# Patient Record
Sex: Female | Born: 1955 | Race: White | Hispanic: No | Marital: Married | Smoking: Never smoker
Health system: Southern US, Community
[De-identification: ages and names within clinical notes are randomized; demographics above are authoritative.]

## PROBLEM LIST (undated history)

## (undated) DIAGNOSIS — L509 Urticaria, unspecified: Secondary | ICD-10-CM

## (undated) DIAGNOSIS — I341 Nonrheumatic mitral (valve) prolapse: Secondary | ICD-10-CM

## (undated) HISTORY — PX: HIP SURGERY: SHX245

## (undated) HISTORY — PX: SHOULDER SURGERY: SHX246

## (undated) HISTORY — DX: Urticaria, unspecified: L50.9

---

## 1977-02-05 HISTORY — PX: APPENDECTOMY: SHX54

## 1991-02-06 HISTORY — PX: OSTEOTOMY: SHX137

## 2008-05-24 ENCOUNTER — Other Ambulatory Visit: Admission: RE | Admit: 2008-05-24 | Discharge: 2008-05-24 | Payer: Self-pay | Admitting: Family Medicine

## 2008-06-01 ENCOUNTER — Encounter: Admission: RE | Admit: 2008-06-01 | Discharge: 2008-06-01 | Payer: Self-pay | Admitting: Family Medicine

## 2008-09-22 ENCOUNTER — Encounter: Admission: RE | Admit: 2008-09-22 | Discharge: 2008-09-22 | Payer: Self-pay | Admitting: Internal Medicine

## 2009-06-03 ENCOUNTER — Other Ambulatory Visit: Admission: RE | Admit: 2009-06-03 | Discharge: 2009-06-03 | Payer: Self-pay | Admitting: *Deleted

## 2010-02-26 ENCOUNTER — Encounter: Payer: Self-pay | Admitting: Family Medicine

## 2013-01-09 ENCOUNTER — Other Ambulatory Visit: Payer: Self-pay | Admitting: Family Medicine

## 2013-01-09 DIAGNOSIS — R1011 Right upper quadrant pain: Secondary | ICD-10-CM

## 2013-01-14 ENCOUNTER — Ambulatory Visit
Admission: RE | Admit: 2013-01-14 | Discharge: 2013-01-14 | Disposition: A | Discharge status: Home / Self Care | Payer: BC Managed Care – PPO | Source: Ambulatory Visit | Attending: Family Medicine | Admitting: Family Medicine

## 2013-01-14 DIAGNOSIS — R1011 Right upper quadrant pain: Secondary | ICD-10-CM

## 2015-02-23 ENCOUNTER — Ambulatory Visit: Payer: Self-pay | Admitting: Allergy and Immunology

## 2015-02-23 ENCOUNTER — Encounter: Payer: Self-pay | Admitting: Allergy and Immunology

## 2015-02-23 ENCOUNTER — Ambulatory Visit (INDEPENDENT_AMBULATORY_CARE_PROVIDER_SITE_OTHER): Payer: BLUE CROSS/BLUE SHIELD | Admitting: Allergy and Immunology

## 2015-02-23 VITALS — BP 128/76 | HR 75 | Temp 98.1°F | Resp 16 | Ht 63.78 in | Wt 171.3 lb

## 2015-02-23 DIAGNOSIS — R05 Cough: Secondary | ICD-10-CM | POA: Diagnosis not present

## 2015-02-23 DIAGNOSIS — H1045 Other chronic allergic conjunctivitis: Secondary | ICD-10-CM | POA: Diagnosis not present

## 2015-02-23 DIAGNOSIS — R053 Chronic cough: Secondary | ICD-10-CM | POA: Insufficient documentation

## 2015-02-23 DIAGNOSIS — H1010 Acute atopic conjunctivitis, unspecified eye: Secondary | ICD-10-CM

## 2015-02-23 DIAGNOSIS — J3089 Other allergic rhinitis: Secondary | ICD-10-CM

## 2015-02-23 DIAGNOSIS — J302 Other seasonal allergic rhinitis: Secondary | ICD-10-CM | POA: Insufficient documentation

## 2015-02-23 MED ORDER — AZELASTINE-FLUTICASONE 137-50 MCG/ACT NA SUSP: 1.0000 | Freq: Two times a day (BID) | NASAL | Status: DC | PRN

## 2015-02-23 MED ORDER — LEVALBUTEROL HCL 1.25 MG/3ML IN NEBU: 1.2500 mg | INHALATION_SOLUTION | Freq: Once | RESPIRATORY_TRACT | Status: DC

## 2015-02-23 MED ORDER — IPRATROPIUM BROMIDE 0.02 % IN SOLN: 0.5000 mg | Freq: Once | RESPIRATORY_TRACT | Status: DC

## 2015-02-23 NOTE — Patient Instructions (Addendum)
Persistent cough Most likely due to upper airway cough syndrome versus bronchial hyperresponsiveness, or multifactorial. Another possibility includes occult chronic sinusitis. We will aggressively treat postnasal drainage over the next month and reevaluate.   A prescription has been provided for Dymista (azelastine/fluticasone) nasal spray, 1 spray per nostril twice daily as needed. Proper nasal spray technique has been discussed and demonstrated.  Nasal saline lavage (NeilMed) as needed has been recommended along with instructions for proper administration.  Guaifenesin 1200 mg twice daily as needed with adequate hydration as discussed.   Secondhand cigarette smoke should be strictly eliminated from the patient's environment.  Perennial and seasonal allergic rhinitis Aeroallergen avoidance measures have been discussed and provided in written form.  A prescription has been provided for Dymista (as above).  Nasal saline lavage and guaifenesin (as above).  If allergen avoidance measures and medications fail to adequately relieve symptoms, we will consider immunotherapy.    Return in about 2 months (around 04/23/2015), or if symptoms worsen or fail to improve.  Control of House Dust Mite Allergen  House dust mites play a major role in allergic asthma and rhinitis.  They occur in environments with high humidity wherever human skin, the food for dust mites is found. High levels have been detected in dust obtained from mattresses, pillows, carpets, upholstered furniture, bed covers, clothes and soft toys.  The principal allergen of the house dust mite is found in its feces.  A gram of dust may contain 1,000 mites and 250,000 fecal particles.  Mite antigen is easily measured in the air during house cleaning activities.    1. Encase mattresses, including the box spring, and pillow, in an air tight cover.  Seal the zipper end of the encased mattresses with wide adhesive tape. 2. Wash the bedding  in water of 130 degrees Farenheit weekly.  Avoid cotton comforters/quilts and flannel bedding: the most ideal bed covering is the dacron comforter. 3. Remove all upholstered furniture from the bedroom. 4. Remove carpets, carpet padding, rugs, and non-washable window drapes from the bedroom.  Wash drapes weekly or use plastic window coverings. 5. Remove all non-washable stuffed toys from the bedroom.  Wash stuffed toys weekly. 6. Have the room cleaned frequently with a vacuum cleaner and a damp dust-mop.  The patient should not be in a room which is being cleaned and should wait 1 hour after cleaning before going into the room. 7. Close and seal all heating outlets in the bedroom.  Otherwise, the room will become filled with dust-laden air.  An electric heater can be used to heat the room. 8. Reduce indoor humidity to less than 50%.  Do not use a humidifier.    Reducing Pollen Exposure  The American Academy of Allergy, Asthma and Immunology suggests the following steps to reduce your exposure to pollen during allergy seasons.    1. Do not hang sheets or clothing out to dry; pollen may collect on these items. 2. Do not mow lawns or spend time around freshly cut grass; mowing stirs up pollen. 3. Keep windows closed at night.  Keep car windows closed while driving. 4. Minimize morning activities outdoors, a time when pollen counts are usually at their highest. 5. Stay indoors as much as possible when pollen counts or humidity is high and on windy days when pollen tends to remain in the air longer. 6. Use air conditioning when possible.  Many air conditioners have filters that trap the pollen spores. 7. Use a HEPA room air filter to  remove pollen form the indoor air you breathe.   Control of Mold Allergen  Mold and fungi can grow on a variety of surfaces provided certain temperature and moisture conditions exist.  Outdoor molds grow on plants, decaying vegetation and soil.  The major outdoor  mold, Alternaria and Cladosporium, are found in very high numbers during hot and dry conditions.  Generally, a late Summer - Fall peak is seen for common outdoor fungal spores.  Rain will temporarily lower outdoor mold spore count, but counts rise rapidly when the rainy period ends.  The most important indoor molds are Aspergillus and Penicillium.  Dark, humid and poorly ventilated basements are ideal sites for mold growth.  The next most common sites of mold growth are the bathroom and the kitchen.  Outdoor [employer] 3. Use air conditioning and keep windows closed 4. Avoid exposure to decaying vegetation. 5. Avoid leaf raking. 6. Avoid grain handling. 7. Consider wearing a face mask if working in moldy areas.  Indoor Mold Control 1. Maintain humidity below 50%. 2. Clean washable surfaces with 5% bleach solution. 3. Remove sources e.g. Contaminated carpets.

## 2015-02-23 NOTE — Assessment & Plan Note (Signed)
Aeroallergen avoidance measures have been discussed and provided in written form.  A prescription has been provided for Dymista (as above).  Nasal saline lavage and guaifenesin (as above).  If allergen avoidance measures and medications fail to adequately relieve symptoms, we will consider immunotherapy.

## 2015-02-23 NOTE — Progress Notes (Signed)
New Patient Note  RE: Tiffany Cunningham MRN: [id number] DOB: 1955-10-15 Date of Office Visit: 02/23/2015  Referring provider: Ladora Daniel, PA-C Primary care provider: Ladora Daniel, PA-C  Chief Complaint: Cough   History of present illness: HPI Comments: Tiffany Cunningham is a 60 y.o. female who presents today for consultation of coughing and rhinitis.  She reports that she has been coughing "most of my life."  The cough is described as productive, particularly in the morning.  She has not awakened from her sleep by the cough.  The cough is exacerbated by exposure to cigarette smoke, certain strong aromas, and with upper respiratory tract infections.  She reports that the cough exacerbation lingers for weeks after other symptoms from the upper respiratory tract infection have resolved.  She denies labored breathing, wheezing, or a history of childhood asthma.  She denies heartburn or water brash.  She has tried proton pump inhibitors without perceived improvement regarding the coughing.  She is not taking an ACE inhibitor.  She experiences nasal congestion, rhinorrhea, postnasal drainage, sneezing, and ocular pruritus.  These symptoms occur year around but tend to be most severe in the springtime.   Assessment and plan: Persistent cough Most likely due to upper airway cough syndrome versus bronchial hyperresponsiveness, or multifactorial. Another possibility includes occult chronic sinusitis. We will aggressively treat postnasal drainage over the next month and reevaluate.   A prescription has been provided for Dymista (azelastine/fluticasone) nasal spray, 1 spray per nostril twice daily as needed. Proper nasal spray technique has been discussed and demonstrated.  Nasal saline lavage (NeilMed) as needed has been recommended along with instructions for proper administration.  Guaifenesin 1200 mg twice daily as needed with adequate hydration as discussed.   Secondhand cigarette smoke should be  strictly eliminated from the patient's environment.  Perennial and seasonal allergic rhinoconjunctivitis Aeroallergen avoidance measures have been discussed and provided in written form.  A prescription has been provided for Dymista (as above).  Nasal saline lavage and guaifenesin (as above).  If allergen avoidance measures and medications fail to adequately relieve symptoms, we will consider immunotherapy.    Meds ordered this encounter  Medications  . levalbuterol (XOPENEX) nebulizer solution 1.25 mg    Sig:   . ipratropium (ATROVENT) nebulizer solution 0.5 mg    Sig:   . Azelastine-Fluticasone 137-50 MCG/ACT SUSP    Sig: Place 1 spray into the nose 2 (two) times daily as needed.    Dispense:  1 Bottle    Refill:  5    Diagnositics: Spirometry: FVC was 2.91 L and FEV1 was 2.45 L (98% predicted) without significant postbronchodilator improvement. Allergy skin testing: Positive to grass pollen, ragweed pollen, weed pollen, tree pollen, molds, and dust mite antigen.    Physical examination: Blood pressure 128/76, pulse 75, temperature 98.1 F (36.7 C), temperature source Oral, resp. rate 16, height 5' 3.78" (1.62 m), weight 171 lb 4.8 oz (77.7 kg), SpO2 97 %.  General: Alert, interactive, in no acute distress. HEENT: TMs pearly gray, turbinates moderately edematous without discharge, post-pharynx erythematous. Neck: Supple without lymphadenopathy. Lungs: Clear to auscultation without wheezing, rhonchi or rales. CV: Normal S1, S2 without murmurs. Abdomen: Nondistended, nontender. Skin: Warm and dry, without lesions or rashes. Extremities:  No clubbing, cyanosis or edema. Neuro:   Grossly intact.  Review of systems: Review of Systems  Constitutional: Negative for fever, chills and weight loss.  HENT: Positive for congestion. Negative for nosebleeds.   Eyes: Negative for blurred vision.  Respiratory: Positive  for cough. Negative for hemoptysis and wheezing.     Cardiovascular: Negative for chest pain.  Gastrointestinal: Negative for heartburn, diarrhea and constipation.  Genitourinary: Negative for dysuria.  Musculoskeletal: Negative for myalgias and joint pain.  Skin: Negative for itching and rash.  Neurological: Negative for dizziness.  Endo/Heme/Allergies: Positive for environmental allergies. Does not bruise/bleed easily.    Past medical history: Past Medical History  Diagnosis Date  . Urticaria     Past surgical history: Past Surgical History  Procedure Laterality Date  . Appendectomy  1979  . Osteotomy  1993    Family history: Family History  Problem Relation Age of Onset  . Allergic rhinitis Mother   . Allergic rhinitis Sister   . Asthma Sister   . Urticaria Neg Hx   . Immunodeficiency Neg Hx   . Asthma Cousin   . Eczema Daughter     Social history: Social History   Social History  . Marital Status: Unknown    Spouse Name: N/A  . Number of Children: N/A  . Years of Education: N/A   Occupational History  . Not on file.   Social History Main Topics  . Smoking status: Never Smoker   . Smokeless tobacco: Never Used  . Alcohol Use: Not on file  . Drug Use: Not on file  . Sexual Activity: Not on file   Other Topics Concern  . Not on file   Social History Narrative  . No narrative on file   Environmental History:  Kamron lives in 77-year-old house with hardwood floors throughout and central air/heat.  There is a cat in the house which has access to her bedroom.  She is a nonsmoker but is exposed to secondhand cigarette smoke in the home.    Medication List       This list is accurate as of: 02/23/15  1:01 PM.  Always use your most recent med list.               aspirin 81 MG tablet  Take 81 mg by mouth daily.     Azelastine-Fluticasone 137-50 MCG/ACT Susp  Place 1 spray into the nose 2 (two) times daily as needed.     DRY EYES OP  Apply 1 drop to eye 2 (two) times daily.     Fish Oil 1000 MG  Caps  Take 1 capsule by mouth daily.     HYZAAR 50-12.5 MG tablet  Generic drug:  losartan-hydrochlorothiazide  Take 1 tablet by mouth daily.     predniSONE 10 MG (21) Tbpk tablet  Commonly known as:  STERAPRED UNI-PAK 21 TAB  Take 2 tablets by mouth 2 (two) times daily. Then will taper down one daily for 6 days        Known medication allergies: Allergies  Allergen Reactions  . Ace Inhibitors     Other reaction(s): Cough    I appreciate the opportunity to take part in this Tiffany Cunningham's care. Please do not hesitate to contact me with questions.  Sincerely,   R. Jorene Guest, MD

## 2015-02-23 NOTE — Assessment & Plan Note (Addendum)
Most likely due to upper airway cough syndrome versus bronchial hyperresponsiveness, or multifactorial. Another possibility includes occult chronic sinusitis. We will aggressively treat postnasal drainage over the next month and reevaluate.   A prescription has been provided for Dymista (azelastine/fluticasone) nasal spray, 1 spray per nostril twice daily as needed. Proper nasal spray technique has been discussed and demonstrated.  Nasal saline lavage (NeilMed) as needed has been recommended along with instructions for proper administration.  Guaifenesin 1200 mg twice daily as needed with adequate hydration as discussed.   Secondhand cigarette smoke should be strictly eliminated from the patient's environment.

## 2015-02-24 NOTE — Addendum Note (Signed)
Addended by: Lauris Poag on: 02/24/2015 09:34 AM   Modules accepted: Orders

## 2015-02-25 ENCOUNTER — Encounter: Payer: Self-pay | Admitting: *Deleted

## 2015-03-10 ENCOUNTER — Ambulatory Visit: Payer: Self-pay | Admitting: Cardiology

## 2015-03-21 ENCOUNTER — Encounter: Payer: Self-pay | Admitting: Cardiology

## 2015-03-22 ENCOUNTER — Encounter: Payer: Self-pay | Admitting: Cardiology

## 2015-03-22 ENCOUNTER — Ambulatory Visit (INDEPENDENT_AMBULATORY_CARE_PROVIDER_SITE_OTHER): Payer: BLUE CROSS/BLUE SHIELD | Admitting: Cardiology

## 2015-03-22 VITALS — BP 138/82 | HR 68 | Ht 63.5 in | Wt 172.1 lb

## 2015-03-22 DIAGNOSIS — R002 Palpitations: Secondary | ICD-10-CM | POA: Diagnosis not present

## 2015-03-22 DIAGNOSIS — I341 Nonrheumatic mitral (valve) prolapse: Secondary | ICD-10-CM

## 2015-03-22 DIAGNOSIS — I340 Nonrheumatic mitral (valve) insufficiency: Secondary | ICD-10-CM

## 2015-03-22 DIAGNOSIS — I10 Essential (primary) hypertension: Secondary | ICD-10-CM | POA: Diagnosis not present

## 2015-03-22 NOTE — Progress Notes (Signed)
Cardiology Office Note    Date:  03/22/2015   ID:  ZABDI MIS, DOB 1955-12-23, MRN [id number]  PCP:  Ladora Daniel, PA-C  Cardiologist:   Donato Schultz, MD     History of Present Illness:  Tiffany Cunningham is a 60 y.o. female here for the evaluation of heart murmur. She is to see a cardiologist for mitral valve prolapse followed with yearly echoes but had not had one done in several years. No significant shortness of breath, no fevers, no chills. No prior episodes of endocarditis. She is being treated well for hypertension. Medications reviewed. Cough has been prominent.  Overall she is not having any symptoms from her mitral valve prolapse. She has never had endocarditis. No shortness of breath, occasionally when laying flat she may feel some chest heaviness however this is chronic and she thinks related to her cough. No orthopnea.  She is never smoked. Her mother and father have not had early coronary disease.  She had palpitations once, went to emergency room for this. It was several years ago. In that she had tried atenolol and propranolol. These medications did not make a difference in her palpitations. She's not had any syncope and they're mostly a nuisance.   Past Medical History  Diagnosis Date  . Urticaria     Past Surgical History  Procedure Laterality Date  . Appendectomy  1979  . Osteotomy  1993    Outpatient Prescriptions Prior to Visit  Medication Sig Dispense Refill  . Artificial Tear Ointment (DRY EYES OP) Apply 1 drop to eye 2 (two) times daily.    Marland Kitchen aspirin 81 MG tablet Take 81 mg by mouth daily.    . Azelastine-Fluticasone 137-50 MCG/ACT SUSP Place 1 spray into the nose 2 (two) times daily as needed. 1 Bottle 5  . losartan-hydrochlorothiazide (HYZAAR) 50-12.5 MG tablet Take 1 tablet by mouth daily.    . Omega-3 Fatty Acids (FISH OIL) 1000 MG CAPS Take 1 capsule by mouth daily.    . predniSONE (STERAPRED UNI-PAK 21 TAB) 10 MG (21) TBPK tablet Take 2 tablets  by mouth 2 (two) times daily. Then will taper down one daily for 6 days     Facility-Administered Medications Prior to Visit  Medication Dose Route Frequency Provider Last Rate Last Dose  . ipratropium (ATROVENT) nebulizer solution 0.5 mg  0.5 mg Nebulization Once Cristal Ford, MD      . levalbuterol Pauline Aus) nebulizer solution 1.25 mg  1.25 mg Nebulization Once Cristal Ford, MD         Allergies:   Ace inhibitors   Social History   Social History  . Marital Status: Unknown    Spouse Name: N/A  . Number of Children: N/A  . Years of Education: N/A   Social History Main Topics  . Smoking status: Never Smoker   . Smokeless tobacco: Never Used  . Alcohol Use: None  . Drug Use: None  . Sexual Activity: Not Asked   Other Topics Concern  . None   Social History Narrative     Family History:  The patient's family history includes Allergic rhinitis in her mother and sister; Asthma in her cousin and sister; Eczema in her daughter. There is no history of Urticaria or Immunodeficiency.   ROS:   Please see the history of present illness.    ROS positive for cough, palpitations All other systems reviewed and are negative.   PHYSICAL EXAM:   VS:  BP 138/82 mmHg  Pulse 68  Ht 5' 3.5" (1.613 m)  Wt 172 lb 1.9 oz (78.073 kg)  BMI 30.01 kg/m2   GEN: Well nourished, well developed, in no acute distress HEENT: normalOccasional cough Neck: no JVD, carotid bruits, or masses Cardiac: RRR; 2/6 holosystolic murmur heard throughout precordium,no rubs, or gallops,no edema  Respiratory:  clear to auscultation bilaterally, normal work of breathing GI: soft, nontender, nondistended, + BS MS: no deformity or atrophy Skin: warm and dry, no rash Neuro:  Alert and Oriented x 3, Strength and sensation are intact Psych: euthymic mood, full affect  Wt Readings from Last 3 Encounters:  03/22/15 172 lb 1.9 oz (78.073 kg)  02/23/15 171 lb 4.8 oz (77.7 kg)      Studies/Labs  Reviewed:   EKG:  EKG is ordered today.  The ekg ordered today demonstrates 03/22/15-sinus rhythm, 68, no other abnormalities. Personally viewed  Recent Labs: No results found for requested labs within last 365 days.   Lipid Panel No results found for: CHOL, TRIG, HDL, CHOLHDL, VLDL, LDLCALC, LDLDIRECT  Additional studies/ records that were reviewed today include:  Office records from [facility] [place], [place] EKG reviewed    ASSESSMENT:    1. Mitral valve prolapse   2. Mitral regurgitation   3. Palpitations   4. Essential hypertension      PLAN:  In order of problems listed above:  1. Mitral valve prolapse-we will go and check an echocardiogram. It is been several years. We discussed the surgical indications for repair. Severe mitral regurgitation with symptoms. We also discussed that she does not require prophylactic antibiotics prior to dental procedures. She understands this. If symptoms change she will let us know. 2. Palpitations-if these become more frequent or worrisome, she will let us know. We always try a new formulation of beta blocker such as metoprolol or perhaps try a calcium channel blocker such as diltiazem. At this point, she does not desire to start any new medications and I agree. 3. Essential hypertension-under good control. Medication reviewed.    Medication Adjustments/Labs and Tests Ordered: Current medicines are reviewed at length with the patient today.  Concerns regarding medicines are outlined above.  Medication changes, Labs and Tests ordered today are listed in the Patient Instructions below. Patient Instructions  Medication Instructions:  Your physician recommends that you continue on your current medications as directed. Please refer to the Current Medication list given to you today.   Labwork: None ordered  Testing/Procedures: Your physician has requested that you have an echocardiogram. Echocardiography is a painless test that uses sound waves  to create images of your heart. It provides your doctor with information about the size and shape of your heart and how well your heart's chambers and valves are working. This procedure takes approximately one hour. There are no restrictions for this procedure.   Follow-Up: Your physician wants you to follow-up in: 1 year with Dr.Skains You will receive a reminder letter in the mail two months in advance. If you don't receive a letter, please call our office to schedule the follow-up appointment.   Any Other Special Instructions Will Be Listed Below (If Applicable).     If you need a refill on your cardiac medications before your next appointment, please call your pharmacy.         Mathews Robinsons, MD  03/22/2015 11:51 AM    [facility] [address] [place], [place], [place]  [address] Phone: [phone]; Fax: [phone]

## 2015-03-22 NOTE — Patient Instructions (Signed)
Medication Instructions:  Your physician recommends that you continue on your current medications as directed. Please refer to the Current Medication list given to you today.   Labwork: None ordered  Testing/Procedures: Your physician has requested that you have an echocardiogram. Echocardiography is a painless test that uses sound waves to create images of your heart. It provides your doctor with information about the size and shape of your heart and how well your heart's chambers and valves are working. This procedure takes approximately one hour. There are no restrictions for this procedure.   Follow-Up: Your physician wants you to follow-up in: 1 year with Dr.Skains You will receive a reminder letter in the mail two months in advance. If you don't receive a letter, please call our office to schedule the follow-up appointment.   Any Other Special Instructions Will Be Listed Below (If Applicable).     If you need a refill on your cardiac medications before your next appointment, please call your pharmacy.   

## 2015-04-01 ENCOUNTER — Ambulatory Visit (HOSPITAL_COMMUNITY): Payer: BLUE CROSS/BLUE SHIELD | Attending: Cardiovascular Disease

## 2015-04-01 ENCOUNTER — Other Ambulatory Visit: Payer: Self-pay

## 2015-04-01 DIAGNOSIS — I071 Rheumatic tricuspid insufficiency: Secondary | ICD-10-CM | POA: Insufficient documentation

## 2015-04-01 DIAGNOSIS — I313 Pericardial effusion (noninflammatory): Secondary | ICD-10-CM | POA: Insufficient documentation

## 2015-04-01 DIAGNOSIS — I340 Nonrheumatic mitral (valve) insufficiency: Secondary | ICD-10-CM | POA: Diagnosis not present

## 2015-04-01 DIAGNOSIS — I119 Hypertensive heart disease without heart failure: Secondary | ICD-10-CM | POA: Diagnosis not present

## 2015-04-01 DIAGNOSIS — I341 Nonrheumatic mitral (valve) prolapse: Secondary | ICD-10-CM | POA: Insufficient documentation

## 2015-04-25 ENCOUNTER — Ambulatory Visit (INDEPENDENT_AMBULATORY_CARE_PROVIDER_SITE_OTHER): Payer: BLUE CROSS/BLUE SHIELD | Admitting: Allergy and Immunology

## 2015-04-25 ENCOUNTER — Encounter: Payer: Self-pay | Admitting: Allergy and Immunology

## 2015-04-25 VITALS — BP 130/80 | HR 78 | Temp 97.9°F | Resp 16

## 2015-04-25 DIAGNOSIS — R053 Chronic cough: Secondary | ICD-10-CM

## 2015-04-25 DIAGNOSIS — J3089 Other allergic rhinitis: Secondary | ICD-10-CM | POA: Diagnosis not present

## 2015-04-25 DIAGNOSIS — R05 Cough: Secondary | ICD-10-CM

## 2015-04-25 NOTE — Progress Notes (Signed)
    Follow-up Note  RE: Tiffany Cunningham MRN: [id number] DOB: Jan 13, 1956 Date of Office Visit: 04/25/2015  Primary care provider: Ladora DanielBEAL, SHERI, PA-C Referring provider: Ladora DanielBeal, Sheri, PA-C  History of present illness: HPI Comments: Tiffany Cunningham is a 60 y.o. female with a history of persistent cough and allergic rhinoconjunctivitis who presents today for follow up.  She was previously seen in this office on 02/23/2015 for her initial visit.  She reports that the cough resolved with the treatment plan provided during her initial evaluation.  Because her symptoms had been so well controlled she discontinued recommended nasal saline irrigation, Dymista, and guaifenesin 2 weeks ago.  Over the past week the cough has started to recur though to a much lesser degree than it had been previously.  She has no nasal or ocular symptom complaints today.   Assessment and plan: Persistent cough Significantly improved.  Continue nasal saline irrigation, Dymista nasal spray as needed, and/or guaifenesin as needed with adequate hydration.  Perennial and seasonal allergic rhinoconjunctivitis  Continue appropriate allergen avoidance measures and Dymista as needed.      Physical examination: Blood pressure 130/80, pulse 78, temperature 97.9 F (36.6 C), temperature source Oral, resp. rate 16.  General: Alert, interactive, in no acute distress. HEENT: TMs pearly gray, turbinates mildly edematous without discharge, post-pharynx moderately erythematous. Neck: Supple without lymphadenopathy. Lungs: Clear to auscultation without wheezing, rhonchi or rales. CV: Normal S1, S2 without murmurs. Skin: Warm and dry, without lesions or rashes.  The following portions of the patient's history were reviewed and updated as appropriate: allergies, current medications, past family history, past medical history, past social history, past surgical history and problem list.    Medication List       This list is accurate  as of: 04/25/15  6:38 PM.  Always use your most recent med list.               aspirin 81 MG tablet  Take 81 mg by mouth daily.     Azelastine-Fluticasone 137-50 MCG/ACT Susp  Place 1 spray into the nose 2 (two) times daily as needed.     DRY EYES OP  Apply 1 drop to eye 2 (two) times daily.     Fish Oil 1000 MG Caps  Take 1 capsule by mouth daily.     HYZAAR 50-12.5 MG tablet  Generic drug:  losartan-hydrochlorothiazide  Take 1 tablet by mouth daily.     meloxicam 15 MG tablet  Commonly known as:  MOBIC  Reported on 04/25/2015     predniSONE 10 MG (21) Tbpk tablet  Commonly known as:  STERAPRED UNI-PAK 21 TAB  Take 2 tablets by mouth 2 (two) times daily. Reported on 04/25/2015        Allergies  Allergen Reactions  . Ace Inhibitors     Other reaction(s): Cough    I appreciate the opportunity to take part in this Richard's care. Please do not hesitate to contact me with questions.  Sincerely,   R. Jorene Guestarter Estell Dillinger, MD

## 2015-04-25 NOTE — Assessment & Plan Note (Signed)
Significantly improved.  Continue nasal saline irrigation, Dymista nasal spray as needed, and/or guaifenesin as needed with adequate hydration.

## 2015-04-25 NOTE — Patient Instructions (Addendum)
Persistent cough Significantly improved.  Continue nasal saline irrigation, Dymista nasal spray as needed, and/or guaifenesin as needed with adequate hydration.  Perennial and seasonal allergic rhinoconjunctivitis  Continue appropriate allergen avoidance measures and Dymista as needed.    Return in about 6 months (around 10/26/2015), or if symptoms worsen or fail to improve.

## 2015-04-25 NOTE — Assessment & Plan Note (Signed)
   Continue appropriate allergen avoidance measures and Dymista as needed. 

## 2015-10-17 ENCOUNTER — Encounter (INDEPENDENT_AMBULATORY_CARE_PROVIDER_SITE_OTHER): Payer: Self-pay

## 2015-10-17 ENCOUNTER — Ambulatory Visit (INDEPENDENT_AMBULATORY_CARE_PROVIDER_SITE_OTHER): Payer: BLUE CROSS/BLUE SHIELD | Admitting: Allergy and Immunology

## 2015-10-17 ENCOUNTER — Encounter: Payer: Self-pay | Admitting: Allergy and Immunology

## 2015-10-17 VITALS — BP 146/82 | HR 98 | Resp 18

## 2015-10-17 DIAGNOSIS — J3089 Other allergic rhinitis: Secondary | ICD-10-CM

## 2015-10-17 DIAGNOSIS — R053 Chronic cough: Secondary | ICD-10-CM

## 2015-10-17 DIAGNOSIS — R05 Cough: Secondary | ICD-10-CM

## 2015-10-17 NOTE — Assessment & Plan Note (Signed)
Stable.  Continue allergen avoidance measures and Dymista nasal spray as needed and nasal saline irrigation as needed.

## 2015-10-17 NOTE — Patient Instructions (Signed)
Persistent cough Improved and stable.  Continue as needed nasal saline irrigation, Dymista nasal spray, and/or guaifenesin as needed with adequate hydration.  Perennial and seasonal allergic rhinoconjunctivitis Stable.  Continue allergen avoidance measures and Dymista nasal spray as needed and nasal saline irrigation as needed.   Return in about 6 months (around 04/15/2016), or if symptoms worsen or fail to improve.

## 2015-10-17 NOTE — Assessment & Plan Note (Signed)
Improved and stable.  Continue as needed nasal saline irrigation, Dymista nasal spray, and/or guaifenesin as needed with adequate hydration.

## 2015-10-17 NOTE — Progress Notes (Signed)
    Follow-up Note  RE: Tiffany Cunningham MRN: [id number] DOB: 1955/07/04 Date of Office Visit: 10/17/2015  Primary care provider: Ladora DanielBEAL, Tiffany Cunningham Referring provider: Ladora DanielBeal, Tiffany Cunningham  History of present illness: Tiffany Cunningham is a 60 y.o. female with a history of persistent cough and allergic rhinoconjunctivitis presenting today for follow up.  She was previously seen in this office on 04/25/15.  She reports that in the interval since her previous visit her coughing has improved.  She still experiences coughing occasionally but it is tolerable.  The cough originates as a tickle at the base of her throat.  She currently uses nasal saline lavage regularly and Dymista sporadically as needed.  She has no nasal or ocular allergy symptom complaints today.   Assessment and plan: Persistent cough Improved and stable.  Continue as needed nasal saline irrigation, Dymista nasal spray, and/or guaifenesin as needed with adequate hydration.  Perennial and seasonal allergic rhinoconjunctivitis Stable.  Continue allergen avoidance measures and Dymista nasal spray as needed and nasal saline irrigation as needed.   Diagnositics: Spirometry:  Normal with an FEV1 of 98% predicted.  Please see scanned spirometry results for details.    Physical examination: Blood pressure (!) 146/82, pulse 98, resp. rate 18.  General: Alert, interactive, in no acute distress. HEENT: TMs pearly gray, turbinates mildly edematous without discharge, post-pharynx mildly erythematous. Neck: Supple without lymphadenopathy. Lungs: Clear to auscultation without wheezing, rhonchi or rales. CV: Normal S1, S2 without murmurs. Skin: Warm and dry, without lesions or rashes.  The following portions of the patient's history were reviewed and updated as appropriate: allergies, current medications, past family history, past medical history, past social history, past surgical history and problem list.    Medication List         Accurate as of 10/17/15 11:12 AM. Always use your most recent med list.          aspirin 81 MG tablet Take 81 mg by mouth daily.   Azelastine-Fluticasone 137-50 MCG/ACT Susp Place 1 spray into the nose 2 (two) times daily as needed.   DRY EYES OP Apply 1 drop to eye 2 (two) times daily.   Fish Oil 1000 MG Caps Take 1 capsule by mouth daily.   HYZAAR 50-12.5 MG tablet Generic drug:  losartan-hydrochlorothiazide Take 1 tablet by mouth daily.   meloxicam 15 MG tablet Commonly known as:  MOBIC Reported on 04/25/2015   predniSONE 10 MG (21) Tbpk tablet Commonly known as:  STERAPRED UNI-PAK 21 TAB Take 2 tablets by mouth 2 (two) times daily. Reported on 04/25/2015       Allergies  Allergen Reactions  . Ace Inhibitors     Other reaction(s): Cough    I appreciate the opportunity to take part in Tiffany Cunningham's care. Please do not hesitate to contact me with questions.  Sincerely,   R. Jorene Guestarter Tiffany Hudman, MD

## 2015-11-11 ENCOUNTER — Encounter (HOSPITAL_COMMUNITY): Payer: Self-pay | Admitting: *Deleted

## 2015-11-11 ENCOUNTER — Emergency Department (HOSPITAL_COMMUNITY)
Admission: EM | Admit: 2015-11-11 | Discharge: 2015-11-11 | Disposition: A | Discharge status: Home / Self Care | Payer: BLUE CROSS/BLUE SHIELD | Attending: Emergency Medicine | Admitting: Emergency Medicine

## 2015-11-11 ENCOUNTER — Emergency Department (HOSPITAL_COMMUNITY): Payer: BLUE CROSS/BLUE SHIELD

## 2015-11-11 DIAGNOSIS — Z79899 Other long term (current) drug therapy: Secondary | ICD-10-CM | POA: Diagnosis not present

## 2015-11-11 DIAGNOSIS — I10 Essential (primary) hypertension: Secondary | ICD-10-CM | POA: Diagnosis not present

## 2015-11-11 DIAGNOSIS — F410 Panic disorder [episodic paroxysmal anxiety] without agoraphobia: Secondary | ICD-10-CM | POA: Diagnosis not present

## 2015-11-11 DIAGNOSIS — Z7982 Long term (current) use of aspirin: Secondary | ICD-10-CM | POA: Diagnosis not present

## 2015-11-11 DIAGNOSIS — R002 Palpitations: Secondary | ICD-10-CM | POA: Diagnosis present

## 2015-11-11 LAB — BASIC METABOLIC PANEL
Anion gap: 12 (ref 5–15)
BUN: 18 mg/dL (ref 6–20)
CO2: 24 mmol/L (ref 22–32)
Calcium: 9.3 mg/dL (ref 8.9–10.3)
Chloride: 103 mmol/L (ref 101–111)
Creatinine, Ser: 0.72 mg/dL (ref 0.44–1.00)
GFR calc Af Amer: >60 mL/min (ref >60)
GLUCOSE: 109 mg/dL — AB (ref 65–99)
POTASSIUM: 3.8 mmol/L (ref 3.5–5.1)
Sodium: 139 mmol/L (ref 135–145)

## 2015-11-11 LAB — CBC WITH DIFFERENTIAL/PLATELET
Basophils Absolute: 0 10*3/uL (ref 0.0–0.1)
Basophils Relative: 0 %
EOS PCT: 1 %
Eosinophils Absolute: 0.1 10*3/uL (ref 0.0–0.7)
HCT: 39.5 % (ref 36.0–46.0)
Hemoglobin: 12.9 g/dL (ref 12.0–15.0)
LYMPHS ABS: 1.6 10*3/uL (ref 0.7–4.0)
LYMPHS PCT: 15 %
MCH: 29.3 pg (ref 26.0–34.0)
MCHC: 32.7 g/dL (ref 30.0–36.0)
MCV: 89.6 fL (ref 78.0–100.0)
MONO ABS: 0.9 10*3/uL (ref 0.1–1.0)
MONOS PCT: 8 %
Neutro Abs: 8 10*3/uL — ABNORMAL HIGH (ref 1.7–7.7)
Neutrophils Relative %: 76 %
PLATELETS: 307 10*3/uL (ref 150–400)
RBC: 4.41 MIL/uL (ref 3.87–5.11)
RDW: 12.3 % (ref 11.5–15.5)
WBC: 10.5 10*3/uL (ref 4.0–10.5)

## 2015-11-11 LAB — D-DIMER, QUANTITATIVE: D-Dimer, Quant: 1.2 ug/mL-FEU — ABNORMAL HIGH (ref 0.00–0.50)

## 2015-11-11 MED ORDER — IOPAMIDOL (ISOVUE-370) INJECTION 76%
100.0000 mL | Freq: Once | INTRAVENOUS | Status: AC | PRN
  Administered 2015-11-11: 60 mL via INTRAVENOUS

## 2015-11-11 MED ORDER — LORAZEPAM 1 MG PO TABS
1.0000 mg | ORAL_TABLET | Freq: Once | ORAL | Status: AC
  Administered 2015-11-11: 1 mg via ORAL
  Filled 2015-11-11: qty 1

## 2015-11-11 NOTE — ED Notes (Cosign Needed)
Received signout from Dr. Ross Marcusourtney Horton at the beginning of shift. Patient with recent foot surgery presenting with symptoms suggestive of a panic attack. Given the recent surgery, a d-dimer was obtained and was elevated. Chest CT angiogram performed showing no evidence of PE. Patient reassured. She does not have any new symptoms and felt comfortable going home. She will follow up appropriately with her orthopedist. Return precaution discussed.  BP 115/65   Pulse 74   Temp 97.3 F (36.3 C) (Oral)   Resp 13   SpO2 99%   Results for orders placed or performed during the hospital encounter of 11/11/15  CBC with Differential  Result Value Ref Range   WBC 10.5 4.0 - 10.5 K/uL   RBC 4.41 3.87 - 5.11 MIL/uL   Hemoglobin 12.9 12.0 - 15.0 g/dL   HCT 13.039.5 86.536.0 - 78.446.0 %   MCV 89.6 78.0 - 100.0 fL   MCH 29.3 26.0 - 34.0 pg   MCHC 32.7 30.0 - 36.0 g/dL   RDW 69.612.3 29.511.5 - 28.415.5 %   Platelets 307 150 - 400 K/uL   Neutrophils Relative % 76 %   Neutro Abs 8.0 (H) 1.7 - 7.7 K/uL   Lymphocytes Relative 15 %   Lymphs Abs 1.6 0.7 - 4.0 K/uL   Monocytes Relative 8 %   Monocytes Absolute 0.9 0.1 - 1.0 K/uL   Eosinophils Relative 1 %   Eosinophils Absolute 0.1 0.0 - 0.7 K/uL   Basophils Relative 0 %   Basophils Absolute 0.0 0.0 - 0.1 K/uL  Basic metabolic panel  Result Value Ref Range   Sodium 139 135 - 145 mmol/L   Potassium 3.8 3.5 - 5.1 mmol/L   Chloride 103 101 - 111 mmol/L   CO2 24 22 - 32 mmol/L   Glucose, Bld 109 (H) 65 - 99 mg/dL   BUN 18 6 - 20 mg/dL   Creatinine, Ser 1.320.72 0.44 - 1.00 mg/dL   Calcium 9.3 8.9 - 44.010.3 mg/dL   GFR calc non Af Amer >60 >60 mL/min   GFR calc Af Amer >60 >60 mL/min   Anion gap 12 5 - 15  D[facility], [facility] ([facility])  Result Value Ref Range   D-Dimer, Quant 1.20 (H) 0.00 - 0.50 ug/mL-FEU   Ct Angio Chest Pe W And/or Wo Contrast  Result Date: 11/11/2015 CLINICAL DATA:  Palpitations starting last night. Elevated D-dimer. Right foot surgery 3 weeks  ago. EXAM: CT ANGIOGRAPHY CHEST WITH CONTRAST TECHNIQUE: Multidetector CT imaging of the chest was performed using the standard protocol during bolus administration of intravenous contrast. Multiplanar CT image reconstructions and MIPs were obtained to evaluate the vascular anatomy. CONTRAST:  60 mL Isovue 370 COMPARISON:  None. FINDINGS: Cardiovascular: Good opacification of the central and segmental pulmonary arteries. No focal filling defects demonstrated. No evidence of significant pulmonary embolus. Normal heart size. Normal caliber thoracic aorta. No evidence of aortic dissection. Mediastinum/Nodes: Esophagus is decompressed. No significant lymphadenopathy in the chest. Lungs/Pleura: Dependent atelectasis in the lung bases. No focal consolidation or airspace disease. No pleural effusions. No pneumothorax Upper Abdomen: No acute abnormality. Musculoskeletal: No chest wall abnormality. No acute or significant osseous findings. Review of the MIP images confirms the above findings. IMPRESSION: No evidence of significant pulmonary embolus. Atelectasis in the lungs. Electronically Signed   By: Burman NievesWilliam  Stevens M.D.   On: 11/11/2015 07:01      Fayrene HelperBowie Timmothy Baranowski, PA-C 11/11/15 [phone]

## 2015-11-11 NOTE — ED Triage Notes (Signed)
Pt reports being woken up from sleep having a panic attack. Pt has not taken any anxiety medicine. Pt states she cannot breath and feels the needs to run

## 2015-11-11 NOTE — ED Provider Notes (Signed)
[facility]PT Provider Note   CSN: [id number] Arrival date & time: 11/11/15  0234     History   Chief Complaint Chief Complaint  Patient presents with  . Panic Attack    HPI Tiffany Cunningham is a 60 y.o. female.  HPI  This is a 60 year old female with recent history of right foot surgery who presents with palpitations. Patient reports she had onset of palpitations and anxiety last night. She states that she had an overwhelming feeling of "needing to get away" and she had to be called by her husband. She took 2 Norco before bed and then subsequently woke up again feeling very anxious and sweaty. She felt like her heart was palpitating. No chest pain or shortness of breath. Denies any significant pain or worsening leg swelling. Currently she states that she just feels anxious.  Past Medical History:  Diagnosis Date  . Urticaria     Patient Active Problem List   Diagnosis Date Noted  . Mitral valve prolapse 03/22/2015  . Mitral regurgitation 03/22/2015  . Palpitations 03/22/2015  . Essential hypertension 03/22/2015  . Persistent cough 02/23/2015  . Perennial and seasonal allergic rhinoconjunctivitis 02/23/2015  . Seasonal allergic conjunctivitis 02/23/2015    Past Surgical History:  Procedure Laterality Date  . APPENDECTOMY  1979  . OSTEOTOMY  1993    OB History    No data available       Home Medications    Prior to Admission medications   Medication Sig Start Date End Date Taking? Authorizing Provider  aspirin 81 MG tablet Take 81 mg by mouth 2 (two) times daily.    Yes Historical Provider, MD  Azelastine-Fluticasone 137-50 MCG/ACT SUSP Place 1 spray into the nose 2 (two) times daily as needed. Patient taking differently: Place 1 spray into the nose 2 (two) times daily as needed (for allergies).  02/23/15  Yes Cristal Fordalph Carter Bobbitt, MD  HYDROcodone-acetaminophen (NORCO/VICODIN) 5-325 MG tablet Take 2 tablets by mouth every 6 (six) hours as needed for moderate  pain.  11/07/15  Yes Historical Provider, MD  losartan-hydrochlorothiazide (HYZAAR) 50-12.5 MG tablet Take 1 tablet by mouth daily. 02/01/15 02/01/16 Yes Historical Provider, MD  Multiple Vitamin (MULTIVITAMIN WITH MINERALS) TABS tablet Take 1 tablet by mouth daily.   Yes Historical Provider, MD  Omega-3 Fatty Acids (FISH OIL) 1000 MG CAPS Take 1 capsule by mouth daily.   Yes Historical Provider, MD    Family History Family History  Problem Relation Age of Onset  . Allergic rhinitis Mother   . Allergic rhinitis Sister   . Asthma Sister   . Asthma Cousin   . Eczema Daughter   . Urticaria Neg Hx   . Immunodeficiency Neg Hx     Social History Social History  Substance Use Topics  . Smoking status: Never Smoker  . Smokeless tobacco: Never Used  . Alcohol use No     Allergies   Ace inhibitors   Review of Systems Review of Systems  Constitutional: Negative for fever.  Respiratory: Negative for shortness of breath.   Cardiovascular: Positive for palpitations. Negative for chest pain and leg swelling.  Gastrointestinal: Negative for abdominal pain, nausea and vomiting.  Neurological: Negative for light-headedness.  All other systems reviewed and are negative.    Physical Exam Updated Vital Signs BP 125/69   Pulse 70   Temp 97.3 F (36.3 C) (Oral)   Resp 13   SpO2 92%   Physical Exam  Constitutional: She is oriented to person, place,  and time. She appears well-developed and well-nourished.  Anxious appearing  HENT:  Head: Normocephalic and atraumatic.  Eyes: Pupils are equal, round, and reactive to light.  Cardiovascular: Normal rate, regular rhythm and normal heart sounds.   Pulmonary/Chest: Effort normal and breath sounds normal. No respiratory distress. She has no wheezes.  Abdominal: Soft. Bowel sounds are normal. There is no tenderness. There is no guarding.  Musculoskeletal:  Casting right lower extremity, no significant swelling noted  Neurological: She is  alert and oriented to person, place, and time.  Skin: Skin is warm and dry.  Psychiatric: She has a normal mood and affect.  Nursing note and vitals reviewed.    ED Treatments / Results  Labs (all labs ordered are listed, but only abnormal results are displayed) Labs Reviewed  CBC WITH DIFFERENTIAL/PLATELET - Abnormal; Notable for the following:       Result Value   Neutro Abs 8.0 (*)    All other components within normal limits  BASIC METABOLIC PANEL - Abnormal; Notable for the following:    Glucose, Bld 109 (*)    All other components within normal limits  D-DIMER, QUANTITATIVE (NOT AT [facility]) - Abnormal; Notable for the following:    D-Dimer, Quant 1.20 (*)    All other components within normal limits    EKG  EKG Interpretation  Date/Time:  Friday November 11 2015 04:46:32 EDT Ventricular Rate:  60 PR Interval:    QRS Duration: 106 QT Interval:  451 QTC Calculation: 451 R Axis:   60 Text Interpretation:  Sinus rhythm Low voltage, precordial leads Confirmed by HORTON  MD, COURTNEY ([id number]) on 11/11/2015 4:49:17 AM       Radiology No results found.  Procedures Procedures (including critical care time)  Medications Ordered in ED Medications  LORazepam (ATIVAN) tablet 1 mg (1 mg Oral Given 11/11/15 0443)     Initial Impression / Assessment and Plan / ED Course  I have reviewed the triage vital signs and the nursing notes.  Pertinent labs & imaging results that were available during my care of the patient were reviewed by me and considered in my medical decision making (see chart for details).  Clinical Course    Patient presents with palpitations and reports anxiety.  Also reports SOB and had a recent foot surgery.  NO signs or symptoms of DVT; however, would be at risk for PE.  Non toxic. VSS.  No hypoxia.  EKG reassuring.  D-dimer mildly elevated.  CT study ordered.  Patient reports improvement of symptoms with ativan.    Signed out to [place], [place] pending CT.   Anticipate d/c if neg.  FOllow-up with PCP if symptoms continue.  Final Clinical Impressions(s) / ED Diagnoses   Final diagnoses:  Panic attack    New Prescriptions New Prescriptions   No medications on file     Shon Baton, MD 11/11/15 2023

## 2016-05-03 ENCOUNTER — Other Ambulatory Visit: Payer: Self-pay

## 2016-05-03 DIAGNOSIS — J3089 Other allergic rhinitis: Secondary | ICD-10-CM

## 2016-05-03 MED ORDER — AZELASTINE-FLUTICASONE 137-50 MCG/ACT NA SUSP: 1.0000 | Freq: Two times a day (BID) | NASAL | 5 refills | Status: DC | PRN

## 2016-05-29 ENCOUNTER — Encounter (HOSPITAL_COMMUNITY): Payer: Self-pay | Admitting: Emergency Medicine

## 2016-05-29 ENCOUNTER — Emergency Department (HOSPITAL_COMMUNITY)
Admission: EM | Admit: 2016-05-29 | Discharge: 2016-05-29 | Disposition: A | Discharge status: Home / Self Care | Payer: BLUE CROSS/BLUE SHIELD | Attending: Emergency Medicine | Admitting: Emergency Medicine

## 2016-05-29 ENCOUNTER — Emergency Department (HOSPITAL_COMMUNITY): Payer: BLUE CROSS/BLUE SHIELD

## 2016-05-29 DIAGNOSIS — R1031 Right lower quadrant pain: Secondary | ICD-10-CM | POA: Diagnosis present

## 2016-05-29 DIAGNOSIS — E876 Hypokalemia: Secondary | ICD-10-CM | POA: Diagnosis not present

## 2016-05-29 DIAGNOSIS — Z79899 Other long term (current) drug therapy: Secondary | ICD-10-CM | POA: Insufficient documentation

## 2016-05-29 DIAGNOSIS — I10 Essential (primary) hypertension: Secondary | ICD-10-CM | POA: Diagnosis not present

## 2016-05-29 DIAGNOSIS — Z7982 Long term (current) use of aspirin: Secondary | ICD-10-CM | POA: Diagnosis not present

## 2016-05-29 DIAGNOSIS — N23 Unspecified renal colic: Secondary | ICD-10-CM

## 2016-05-29 DIAGNOSIS — N201 Calculus of ureter: Secondary | ICD-10-CM | POA: Diagnosis not present

## 2016-05-29 LAB — URINALYSIS, ROUTINE W REFLEX MICROSCOPIC
Bilirubin Urine: NEGATIVE
GLUCOSE, UA: NEGATIVE mg/dL
Ketones, ur: NEGATIVE mg/dL
LEUKOCYTES UA: NEGATIVE
NITRITE: NEGATIVE
PH: 5 (ref 5.0–8.0)
Protein, ur: NEGATIVE mg/dL
SPECIFIC GRAVITY, URINE: 1.025 (ref 1.005–1.030)

## 2016-05-29 LAB — COMPREHENSIVE METABOLIC PANEL
ALBUMIN: 4 g/dL (ref 3.5–5.0)
ALT: 32 U/L (ref 14–54)
ANION GAP: 12 (ref 5–15)
AST: 24 U/L (ref 15–41)
Alkaline Phosphatase: 62 U/L (ref 38–126)
BUN: 17 mg/dL (ref 6–20)
CHLORIDE: 106 mmol/L (ref 101–111)
CO2: 21 mmol/L — AB (ref 22–32)
CREATININE: 0.7 mg/dL (ref 0.44–1.00)
Calcium: 9.1 mg/dL (ref 8.9–10.3)
GFR calc non Af Amer: >60 mL/min (ref >60)
Glucose, Bld: 135 mg/dL — ABNORMAL HIGH (ref 65–99)
Potassium: 3.3 mmol/L — ABNORMAL LOW (ref 3.5–5.1)
SODIUM: 139 mmol/L (ref 135–145)
Total Bilirubin: 0.6 mg/dL (ref 0.3–1.2)
Total Protein: 6.9 g/dL (ref 6.5–8.1)

## 2016-05-29 LAB — CBC
HCT: 40.4 % (ref 36.0–46.0)
HEMOGLOBIN: 13.5 g/dL (ref 12.0–15.0)
MCH: 29.3 pg (ref 26.0–34.0)
MCHC: 33.4 g/dL (ref 30.0–36.0)
MCV: 87.8 fL (ref 78.0–100.0)
PLATELETS: 286 10*3/uL (ref 150–400)
RBC: 4.6 MIL/uL (ref 3.87–5.11)
RDW: 12.4 % (ref 11.5–15.5)
WBC: 8 10*3/uL (ref 4.0–10.5)

## 2016-05-29 LAB — LIPASE, BLOOD: LIPASE: 27 U/L (ref 11–51)

## 2016-05-29 MED ORDER — METOCLOPRAMIDE HCL 5 MG/ML IJ SOLN
10.0000 mg | Freq: Once | INTRAMUSCULAR | Status: AC
  Administered 2016-05-29: 10 mg via INTRAVENOUS
  Filled 2016-05-29: qty 2

## 2016-05-29 MED ORDER — TAMSULOSIN HCL 0.4 MG PO CAPS: 0.4000 mg | ORAL_CAPSULE | Freq: Every day | ORAL | 0 refills | Status: DC

## 2016-05-29 MED ORDER — ONDANSETRON 4 MG PO TBDP
4.0000 mg | ORAL_TABLET | Freq: Once | ORAL | Status: AC | PRN
  Administered 2016-05-29: 4 mg via ORAL

## 2016-05-29 MED ORDER — FENTANYL CITRATE (PF) 100 MCG/2ML IJ SOLN
INTRAMUSCULAR | Status: AC
  Filled 2016-05-29: qty 2

## 2016-05-29 MED ORDER — ONDANSETRON 4 MG PO TBDP
ORAL_TABLET | ORAL | Status: AC
  Filled 2016-05-29: qty 1

## 2016-05-29 MED ORDER — POTASSIUM CHLORIDE CRYS ER 20 MEQ PO TBCR
40.0000 meq | EXTENDED_RELEASE_TABLET | Freq: Once | ORAL | Status: AC
  Administered 2016-05-29: 40 meq via ORAL
  Filled 2016-05-29: qty 2

## 2016-05-29 MED ORDER — HYDROMORPHONE HCL 1 MG/ML IJ SOLN
2.0000 mg | Freq: Once | INTRAMUSCULAR | Status: DC
  Filled 2016-05-29: qty 2

## 2016-05-29 MED ORDER — HYDROMORPHONE HCL 1 MG/ML IJ SOLN: 1.0000 mg | Freq: Once | INTRAMUSCULAR | Status: DC

## 2016-05-29 MED ORDER — KETOROLAC TROMETHAMINE 30 MG/ML IJ SOLN
30.0000 mg | Freq: Once | INTRAMUSCULAR | Status: AC
  Administered 2016-05-29: 30 mg via INTRAVENOUS
  Filled 2016-05-29: qty 1

## 2016-05-29 MED ORDER — HYDROMORPHONE HCL 1 MG/ML IJ SOLN
2.0000 mg | Freq: Once | INTRAMUSCULAR | Status: AC
  Administered 2016-05-29: 2 mg via INTRAVENOUS

## 2016-05-29 MED ORDER — OXYCODONE-ACETAMINOPHEN 5-325 MG PO TABS: 1.0000 | ORAL_TABLET | Freq: Four times a day (QID) | ORAL | 0 refills | Status: DC | PRN

## 2016-05-29 MED ORDER — FENTANYL CITRATE (PF) 100 MCG/2ML IJ SOLN
50.0000 ug | INTRAMUSCULAR | Status: DC | PRN
  Administered 2016-05-29: 50 ug via NASAL

## 2016-05-29 NOTE — Discharge Instructions (Signed)
Take Advil for mild pain or the pain medicine prescribed for bad pain. Call [facility] tomorrow to schedule follow-up appointment for within one week. Return to the emergency Department if your pain is not well controlled or if concern for any reason

## 2016-05-29 NOTE — ED Notes (Signed)
Pt stable, ambulatory, states understanding of discharge instructions 

## 2016-05-29 NOTE — ED Notes (Signed)
Pt wheeled to restroom in wheelchair.  Pt unable to urinate at this time.

## 2016-05-29 NOTE — ED Notes (Signed)
Pt's sats dropped to 88%; 2 L  placed. Patient alert and oriented and appears much more comfortable. Denies nausea.

## 2016-05-29 NOTE — ED Triage Notes (Signed)
Pt had sudden onset of RLQ pain with n/v with pain in right flank as well pain 10/10 with actively vomiting in triage. Pt diaphoretic.

## 2016-05-29 NOTE — ED Notes (Signed)
Patient transported to CT 

## 2016-05-29 NOTE — ED Provider Notes (Addendum)
[facility] Provider Note   CSN: [id number] Arrival date & time: 05/29/16  4098     History   Chief Complaint Chief Complaint  Patient presents with  . Abdominal Pain    HPI Tiffany Cunningham is a 61 y.o. female.  HPI Complains of right flank pain radiating to right lower quadrant onset this morning feels like kidney stone she's had in the past. Associated symptoms include nausea. Nothing makes symptoms better or worse pain is severe and sharp. No treatment prior to coming here. No other associated symptoms Past Medical History:  Diagnosis Date  . Urticaria   Kidney stone  Patient Active Problem List   Diagnosis Date Noted  . Mitral valve prolapse 03/22/2015  . Mitral regurgitation 03/22/2015  . Palpitations 03/22/2015  . Essential hypertension 03/22/2015  . Persistent cough 02/23/2015  . Perennial and seasonal allergic rhinoconjunctivitis 02/23/2015  . Seasonal allergic conjunctivitis 02/23/2015    Past Surgical History:  Procedure Laterality Date  . APPENDECTOMY  1979  . OSTEOTOMY  1993    OB History    No data available       Home Medications    Prior to Admission medications   Medication Sig Start Date End Date Taking? Authorizing Provider  aspirin 81 MG tablet Take 81 mg by mouth 2 (two) times daily.     Historical Provider, MD  Azelastine-Fluticasone 137-50 MCG/ACT SUSP Place 1 spray into the nose 2 (two) times daily as needed. 05/03/16   Cristal Ford, MD  HYDROcodone-acetaminophen (NORCO/VICODIN) 5-325 MG tablet Take 2 tablets by mouth every 6 (six) hours as needed for moderate pain.  11/07/15   Historical Provider, MD  losartan-hydrochlorothiazide (HYZAAR) 50-12.5 MG tablet Take 1 tablet by mouth daily. 02/01/15 02/01/16  Historical Provider, MD  Multiple Vitamin (MULTIVITAMIN WITH MINERALS) TABS tablet Take 1 tablet by mouth daily.    Historical Provider, MD  Omega-3 Fatty Acids (FISH OIL) 1000 MG CAPS Take 1 capsule by mouth daily.     Historical Provider, MD    Family History Family History  Problem Relation Age of Onset  . Allergic rhinitis Mother   . Allergic rhinitis Sister   . Asthma Sister   . Asthma Cousin   . Eczema Daughter   . Urticaria Neg Hx   . Immunodeficiency Neg Hx     Social History Social History  Substance Use Topics  . Smoking status: Never Smoker  . Smokeless tobacco: Never Used  . Alcohol use No     Allergies   Ace inhibitors   Review of Systems Review of Systems  Constitutional: Negative.   HENT: Negative.   Respiratory: Negative.   Cardiovascular: Negative.   Gastrointestinal: Positive for abdominal pain and nausea.  Genitourinary: Positive for flank pain.  Skin: Negative.   Neurological: Negative.   Psychiatric/Behavioral: Negative.   All other systems reviewed and are negative.    Physical Exam Updated Vital Signs BP (!) 126/93 (BP Location: Right Arm)   Pulse 79   Temp 98 F (36.7 C) (Oral)   Resp 16   SpO2 100%   Physical Exam  Constitutional: She is oriented to person, place, and time. She appears well-developed and well-nourished. She appears distressed.  Appears uncomfortable Glasgow Coma Score 15  HENT:  Head: Normocephalic and atraumatic.  Eyes: Conjunctivae are normal. Pupils are equal, round, and reactive to light.  Neck: Neck supple. No tracheal deviation present. No thyromegaly present.  Cardiovascular: Normal rate and regular rhythm.   No murmur heard.  Pulmonary/Chest: Effort normal and breath sounds normal.  Abdominal: Soft. Bowel sounds are normal. She exhibits no distension. There is no tenderness.  Genitourinary:  Genitourinary Comments: No flank tenderness  Musculoskeletal: Normal range of motion. She exhibits no edema or tenderness.  Neurological: She is alert and oriented to person, place, and time. Coordination normal.  Skin: Skin is warm and dry. No rash noted.  Psychiatric: She has a normal mood and affect.  Nursing note and vitals  reviewed.    ED Treatments / Results  Labs (all labs ordered are listed, but only abnormal results are displayed) Labs Reviewed  COMPREHENSIVE METABOLIC PANEL - Abnormal; Notable for the following:       Result Value   Potassium 3.3 (*)    CO2 21 (*)    Glucose, Bld 135 (*)    All other components within normal limits  LIPASE, BLOOD  CBC  URINALYSIS, ROUTINE W REFLEX MICROSCOPIC    EKG  EKG Interpretation None       Radiology No results found.  Procedures Procedures (including critical care time)  Medications Ordered in ED Medications  fentaNYL (SUBLIMAZE) injection 50 mcg (50 mcg Nasal Not Given 05/29/16 1138)  ondansetron (ZOFRAN-ODT) disintegrating tablet 4 mg (4 mg Oral Given 05/29/16 1005)  HYDROmorphone (DILAUDID) injection 2 mg (2 mg Intravenous Given 05/29/16 1140)  metoCLOPramide (REGLAN) injection 10 mg (10 mg Intravenous Given 05/29/16 1143)    Results for orders placed or performed during the hospital encounter of 05/29/16  Lipase, blood  Result Value Ref Range   Lipase 27 11 - 51 U/L  Comprehensive metabolic panel  Result Value Ref Range   Sodium 139 135 - 145 mmol/L   Potassium 3.3 (L) 3.5 - 5.1 mmol/L   Chloride 106 101 - 111 mmol/L   CO2 21 (L) 22 - 32 mmol/L   Glucose, Bld 135 (H) 65 - 99 mg/dL   BUN 17 6 - 20 mg/dL   Creatinine, Ser 1.61 0.44 - 1.00 mg/dL   Calcium 9.1 8.9 - 09.6 mg/dL   Total Protein 6.9 6.5 - 8.1 g/dL   Albumin 4.0 3.5 - 5.0 g/dL   AST 24 15 - 41 U/L   ALT 32 14 - 54 U/L   Alkaline Phosphatase 62 38 - 126 U/L   Total Bilirubin 0.6 0.3 - 1.2 mg/dL   GFR calc non Af Amer >60 >60 mL/min   GFR calc Af Amer >60 >60 mL/min   Anion gap 12 5 - 15  CBC  Result Value Ref Range   WBC 8.0 4.0 - 10.5 K/uL   RBC 4.60 3.87 - 5.11 MIL/uL   Hemoglobin 13.5 12.0 - 15.0 g/dL   HCT 04.5 40.9 - 81.1 %   MCV 87.8 78.0 - 100.0 fL   MCH 29.3 26.0 - 34.0 pg   MCHC 33.4 30.0 - 36.0 g/dL   RDW 91.4 78.2 - 95.6 %   Platelets 286 150 - 400  K/uL  Urinalysis, Routine w reflex microscopic  Result Value Ref Range   Color, Urine YELLOW YELLOW   APPearance CLOUDY (A) CLEAR   Specific Gravity, Urine 1.025 1.005 - 1.030   pH 5.0 5.0 - 8.0   Glucose, UA NEGATIVE NEGATIVE mg/dL   Hgb urine dipstick LARGE (A) NEGATIVE   Bilirubin Urine NEGATIVE NEGATIVE   Ketones, ur NEGATIVE NEGATIVE mg/dL   Protein, ur NEGATIVE NEGATIVE mg/dL   Nitrite NEGATIVE NEGATIVE   Leukocytes, UA NEGATIVE NEGATIVE   RBC / HPF TOO NUMEROUS  TO COUNT 0 - 5 RBC/hpf   WBC, UA 0-5 0 - 5 WBC/hpf   Bacteria, UA RARE (A) NONE SEEN   Squamous Epithelial / LPF 0-5 (A) NONE SEEN   Mucous PRESENT    Ca Oxalate Crys, UA PRESENT    Ct Renal Stone Study  Result Date: 05/29/2016 CLINICAL DATA:  Acute onset right lower quadrant and right flank pain with nausea and vomiting today. EXAM: CT ABDOMEN AND PELVIS WITHOUT CONTRAST TECHNIQUE: Multidetector CT imaging of the abdomen and pelvis was performed following the standard protocol without IV contrast. COMPARISON:  None. FINDINGS: Lower chest: Minimal dependent atelectasis is present in the lung bases. No pleural or pericardial effusion. Hepatobiliary: There is diffuse fatty infiltration of the liver. No focal lesion is identified. The gallbladder and biliary tree appear normal. Pancreas: Unremarkable. No pancreatic ductal dilatation or surrounding inflammatory changes. Spleen: Normal in size without focal abnormality. Adrenals/Urinary Tract: There is mild to moderate right hydronephrosis with stranding about the right kidney due to a punctate stone measuring 1-2 mm in the distal right ureter approximately 3 cm proximal to the UVJ. Two punctate nonobstructing stones are seen in the mid and lower pole of the left kidney. No other urinary tract stones. Urinary bladder and adrenal glands are normal. Stomach/Bowel: A few colonic diverticula are identified. No diverticulitis. The appendix has been removed. The stomach appears normal.  Duodenal diverticulum is noted. Small bowel is otherwise unremarkable. Vascular/Lymphatic: No atherosclerosis or lymphadenopathy. Retroaortic left renal vein noted. Reproductive: Uterus and bilateral adnexa are unremarkable. Other: No fluid collection. Musculoskeletal: Negative. IMPRESSION: Mild to moderate right hydronephrosis due to a punctate stone approximately 3 cm proximal to the UVJ. Two punctate nonobstructing stones are seen the lower pole of the left kidney. Fatty infiltration of the liver. Diverticulosis without diverticulitis. Electronically Signed   By: Drusilla Kanner M.D.   On: 05/29/2016 12:23   Initial Impression / Assessment and Plan / ED Course  I have reviewed the triage vital signs and the nursing notes.  Pertinent labs & imaging results that were available during my care of the patient were reviewed by me and considered in my medical decision making (see chart for details).     3:45 PM patient asymptomatic after treatment with intervenous opioids, IV Toradol and IV antiemetics. Feels ready to go home. Plan prescriptions Flomax, Percocet, . Ibuprofen. Referral [facility] and she received oral potassium supplementation while here [facility] Controlled Substance reporting System queried Final Clinical Impressions(s) / ED Diagnoses  Diagnosis #1 ureteral colic #2 hypokalemia  Final diagnoses:  None    New Prescriptions New Prescriptions   No medications on file     Doug Sou, MD 05/29/16 1608    Doug Sou, MD 05/29/16 [phone]

## 2016-05-29 NOTE — ED Notes (Signed)
Patient denies pain and is resting comfortably.  

## 2018-01-15 ENCOUNTER — Encounter (INDEPENDENT_AMBULATORY_CARE_PROVIDER_SITE_OTHER): Payer: Self-pay

## 2018-01-15 ENCOUNTER — Encounter: Payer: Self-pay | Admitting: Cardiology

## 2018-01-15 ENCOUNTER — Ambulatory Visit: Payer: BLUE CROSS/BLUE SHIELD | Admitting: Cardiology

## 2018-01-15 VITALS — BP 130/78 | HR 70 | Ht 63.5 in | Wt 173.2 lb

## 2018-01-15 DIAGNOSIS — I10 Essential (primary) hypertension: Secondary | ICD-10-CM

## 2018-01-15 DIAGNOSIS — R252 Cramp and spasm: Secondary | ICD-10-CM | POA: Diagnosis not present

## 2018-01-15 DIAGNOSIS — R002 Palpitations: Secondary | ICD-10-CM

## 2018-01-15 DIAGNOSIS — R011 Cardiac murmur, unspecified: Secondary | ICD-10-CM | POA: Diagnosis not present

## 2018-01-15 NOTE — Patient Instructions (Signed)
Medication Instructions:  No changes If you need a refill on your cardiac medications before your next appointment, please call your pharmacy.   Lab work: none If you have labs (blood work) drawn today and your tests are completely normal, you will receive your results only by: Marland Kitchen. MyChart Message (if you have MyChart) OR . A paper copy in the mail If you have any lab test that is abnormal or we need to change your treatment, we will call you to review the results.  Testing/Procedures: none  Follow-Up: At [facility], you and your health needs are our priority.  As part of our continuing mission to provide you with exceptional heart care, we have created designated Provider Care Teams.  These Care Teams include your primary Cardiologist (physician) and Advanced Practice Providers (APPs -  Physician Assistants and Nurse Practitioners) who all work together to provide you with the care you need, when you need it. You will need a follow up appointment in 2 years.  Please call our office 2 months in advance to schedule this appointment.  You may see Donato SchultzMark Skains, MD or one of the following Advanced Practice Providers on your designated Care Team:   Norma FredricksonLori Gerhardt, NP Nada BoozerLaura Ingold, NP . Georgie ChardJill McDaniel, NP  Any Other Special Instructions Will Be Listed Below (If Applicable).

## 2018-01-15 NOTE — Progress Notes (Signed)
Cardiology Office Note:    Date:  01/15/2018   ID:  Tiffany PiqueSusan M Linares, DOB October 19, 1955, MRN [id number]  PCP:  Ladora DanielBeal, Sheri, PA-C  Cardiologist:  Donato SchultzMark Haydyn Liddell, MD  Electrophysiologist:  None   Referring MD: Ladora DanielBeal, Sheri, PA-C     History of Present Illness:    Tiffany Cunningham is a 62 y.o. female here for follow-up of heart murmur.  Prior echocardiogram on 04/01/2015: - Left ventricle: The cavity size was normal. There was mild focal   basal hypertrophy of the septum. Systolic function was vigorous.   The estimated ejection fraction was in the range of 65% to 70%.   Wall motion was normal; there were no regional wall motion   abnormalities. Doppler parameters are consistent with abnormal   left ventricular relaxation (grade 1 diastolic dysfunction).   Doppler parameters are consistent with high ventricular filling   pressure. - Aortic valve: Transvalvular velocity was within the normal range.   There was no stenosis. There was no regurgitation. - Mitral valve: Transvalvular velocity was within the normal range.   There was no evidence for stenosis. There was no regurgitation. - Left atrium: The atrium was mildly dilated. - Right ventricle: The cavity size was normal. Wall thickness was   normal. Systolic function was normal. - Tricuspid valve: There was trivial regurgitation. - Pulmonary arteries: Systolic pressure was within the normal   range. PA peak pressure: 31 mm Hg (S). - Inferior vena cava: The vessel was normal in size. - Pericardium, extracardiac: A small pericardial effusion was   identified. Features were not consistent with tamponade   physiology.  At that time, there was no evidence of mitral valve prolapse.  No regurgitation.  Normal EF.  At prior visit she was having some palpitations.  Conservatively managed.  Blood pressure was under good control.  She states that she does have her palpitations daily.  They are not prolonged episodes.  Does not clinically sound  like atrial fibrillation.  Does not desire to have a monitor placed and I agree.  Decongestants seem to exacerbate this.  No syncope, no chest pain  No early CAD FHX.  Past Medical History:  Diagnosis Date  . Urticaria     Past Surgical History:  Procedure Laterality Date  . APPENDECTOMY  1979  . OSTEOTOMY  1993    Current Medications: Current Meds  Medication Sig  . acetaminophen (TYLENOL) 325 MG tablet Take 325-650 mg by mouth every 6 (six) hours as needed (for pain).  . Azelastine-Fluticasone 137-50 MCG/ACT SUSP Place 1 spray into the nose 2 (two) times daily as needed.  . Guaifenesin 1200 MG TB12 Take 1 tablet by mouth 2 (two) times daily as needed.  Marland Kitchen. losartan-hydrochlorothiazide (HYZAAR) 100-25 MG tablet Take 1 tablet by mouth daily.  . magnesium oxide (MAG-OX) 400 MG tablet Take 400 mg by mouth daily.  . Multiple Vitamin (MULTIVITAMIN WITH MINERALS) TABS tablet Take 1 tablet by mouth daily.  . Omega-3 Fatty Acids (FISH OIL) 1000 MG CAPS Take 1 capsule by mouth daily.  Marland Kitchen. Propylene Glycol 0.6 % SOLN Place 1 drop into both eyes daily.   Current Facility-Administered Medications for the 01/15/18 encounter (Office Visit) with Jake BatheSkains, Elika Godar C, MD  Medication  . ipratropium (ATROVENT) nebulizer solution 0.5 mg  . levalbuterol (XOPENEX) nebulizer solution 1.25 mg     Allergies:   Ace inhibitors   Social History   Socioeconomic History  . Marital status: Married    Spouse name: Not on file  .  Number of children: Not on file  . Years of education: Not on file  . Highest education level: Not on file  Occupational History  . Not on file  Social Needs  . Financial resource strain: Not on file  . Food insecurity:    Worry: Not on file    Inability: Not on file  . Transportation needs:    Medical: Not on file    Non-medical: Not on file  Tobacco Use  . Smoking status: Never Smoker  . Smokeless tobacco: Never Used  Substance and Sexual Activity  . Alcohol use: No     Alcohol/week: 0.0 standard drinks  . Drug use: No  . Sexual activity: Not on file  Lifestyle  . Physical activity:    Days per week: Not on file    Minutes per session: Not on file  . Stress: Not on file  Relationships  . Social connections:    Talks on phone: Not on file    Gets together: Not on file    Attends religious service: Not on file    Active member of club or organization: Not on file    Attends meetings of clubs or organizations: Not on file    Relationship status: Not on file  Other Topics Concern  . Not on file  Social History Narrative  . Not on file     Family History: The patient's family history includes Allergic rhinitis in her mother and sister; Asthma in her cousin and sister; Eczema in her daughter. There is no history of Urticaria or Immunodeficiency.  ROS:   Please see the history of present illness.    Denies any fevers chills nausea vomiting syncope bleeding all other systems reviewed and are negative.  EKGs/Labs/Other Studies Reviewed:    The following studies were reviewed today: Prior echocardiogram EKG lab work office notes  EKG:  EKG is  ordered today.  The ekg ordered today demonstrates sinus rhythm with mild sinus arrhythmia heart rate 70 bpm with no other abnormalities.  Recent Labs: No results found for requested labs within last 8760 hours.  Recent Lipid Panel No results found for: CHOL, TRIG, HDL, CHOLHDL, VLDL, LDLCALC, LDLDIRECT  Physical Exam:    VS:  BP 130/78   Pulse 70   Ht 5' 3.5" (1.613 m)   Wt 173 lb 3.2 oz (78.6 kg)   BMI 30.20 kg/m     Wt Readings from Last 3 Encounters:  01/15/18 173 lb 3.2 oz (78.6 kg)  03/22/15 172 lb 1.9 oz (78.1 kg)  02/23/15 171 lb 4.8 oz (77.7 kg)     GEN:  Well nourished, well developed in no acute distress HEENT: Normal NECK: No JVD; No carotid bruits LYMPHATICS: No lymphadenopathy CARDIAC: RRR, 2/6 SM, no rubs, gallops RESPIRATORY:  Clear to auscultation without rales, wheezing or  rhonchi  ABDOMEN: Soft, non-tender, non-distended MUSCULOSKELETAL:  No edema; No deformity  SKIN: Warm and dry NEUROLOGIC:  Alert and oriented x 3 PSYCHIATRIC:  Normal affect   ASSESSMENT:    1. Palpitations   2. Heart murmur   3. Leg cramps   4. Essential hypertension    PLAN:    In order of problems listed above:  Palpitations -Occasional fluttering, daily.  No high risk symptoms such as syncope, shortness of breath, chest pain.  Likely PVCs or PACs.  Conservative management.  No need for monitoring.  If symptoms worsen or become more worrisome we can always proceed with monitoring at that time.  These  palpitations were exacerbated when she took a decongestant that likely had a Sudafed-like compound.  I recommended to her that she take Coricidin if she has a cold.  Heart murmur -Secondary to vigorous contraction with mild focal basal septal hypertrophy.  Doing well.  Essential hypertension -Continue with losartan hydrochlorothiazide combination.  Controlled.  Leg cramps - Much improved with magnesium 400 mg tablet daily.   Medication Adjustments/Labs and Tests Ordered: Current medicines are reviewed at length with the patient today.  Concerns regarding medicines are outlined above.  Orders Placed This Encounter  Procedures  . EKG 12-Lead   No orders of the defined types were placed in this encounter.   Patient Instructions  Medication Instructions:  No changes If you need a refill on your cardiac medications before your next appointment, please call your pharmacy.   Lab work: none If you have labs (blood work) drawn today and your tests are completely normal, you will receive your results only by: Marland Kitchen MyChart Message (if you have MyChart) OR . A paper copy in the mail If you have any lab test that is abnormal or we need to change your treatment, we will call you to review the results.  Testing/Procedures: none  Follow-Up: At [facility], you and your health  needs are our priority.  As part of our continuing mission to provide you with exceptional heart care, we have created designated Provider Care Teams.  These Care Teams include your primary Cardiologist (physician) and Advanced Practice Providers (APPs -  Physician Assistants and Nurse Practitioners) who all work together to provide you with the care you need, when you need it. You will need a follow up appointment in 2 years.  Please call our office 2 months in advance to schedule this appointment.  You may see Donato Schultz, MD or one of the following Advanced Practice Providers on your designated Care Team:   Norma Fredrickson, NP Nada Boozer, NP . Georgie Chard, NP  Any Other Special Instructions Will Be Listed Below (If Applicable).       Signed, Donato Schultz, MD  01/15/2018 8:59 AM    [facility]

## 2018-04-01 IMAGING — CT CT RENAL STONE PROTOCOL
2 of 4 series · 16 of 46 positions shown, 18 images · non-contrast
Comparison: None.

CLINICAL DATA: Acute onset right lower quadrant and right flank
pain with nausea and vomiting today.

EXAM:
CT ABDOMEN AND PELVIS WITHOUT CONTRAST
TECHNIQUE: Multidetector CT imaging of the abdomen and pelvis was performed
following the standard protocol without IV contrast.

[Series 3: renal stone 5.0 · axial · 0.71mm/px · z∈[+819,+1229]mm · 13 of 90 slices shown, 15 images]
[im 4/90  soft-tissue]
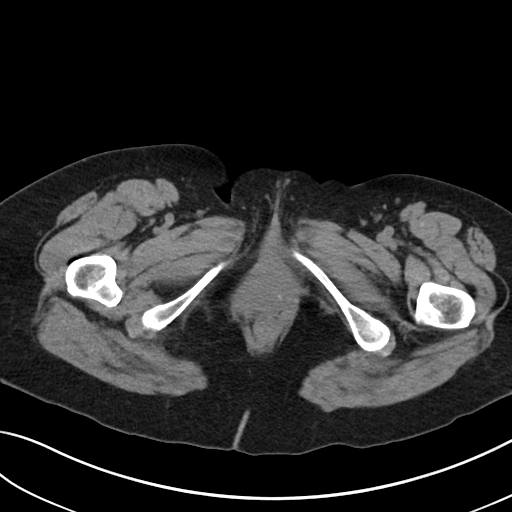
[im 4/90  bone]
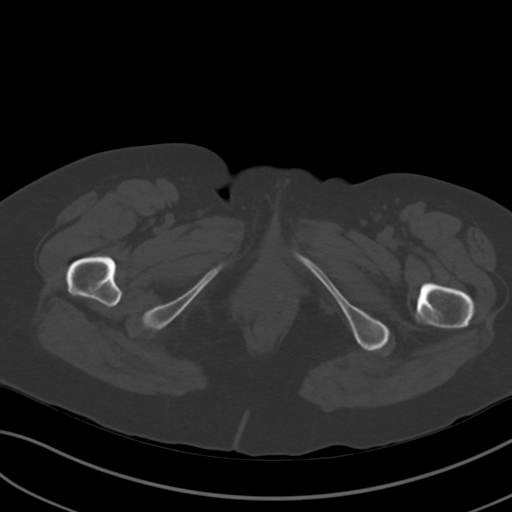
[im 12/90  soft-tissue]
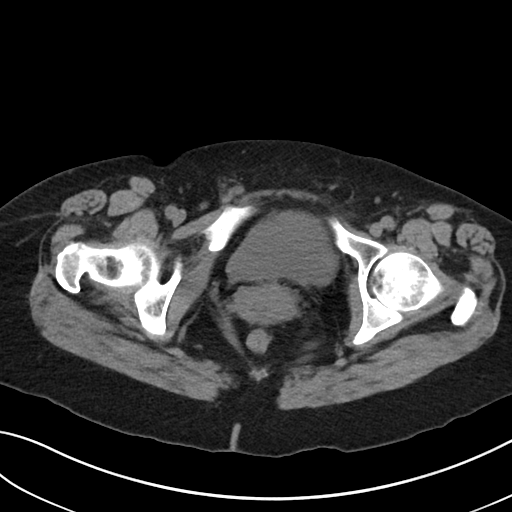
[im 19/90  soft-tissue]
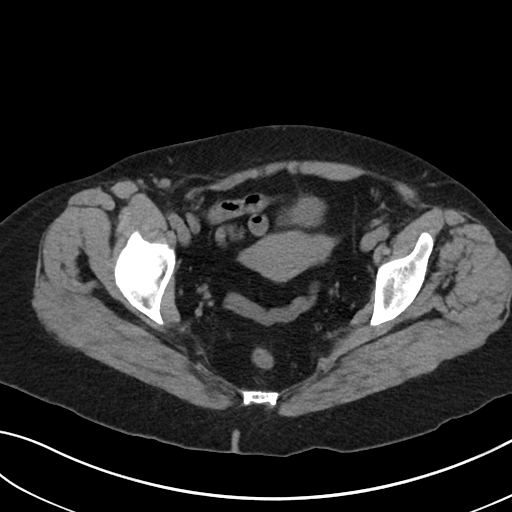
[im 26/90  soft-tissue]
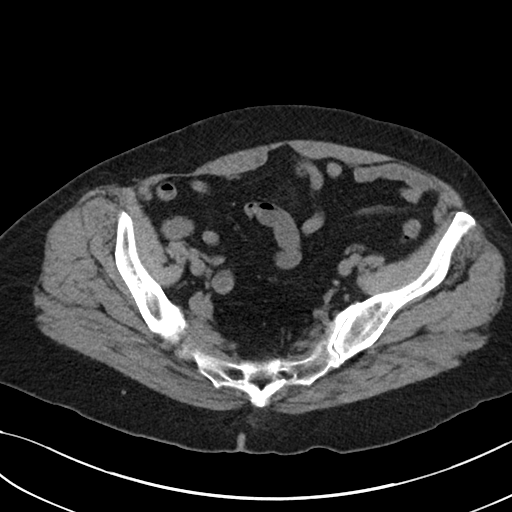
[im 30/90  soft-tissue]
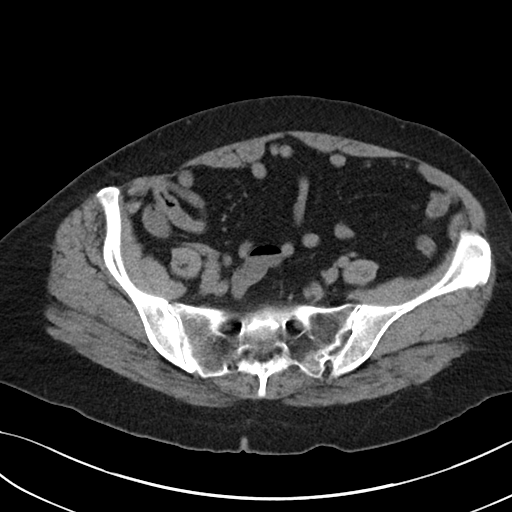
[im 38/90  soft-tissue]
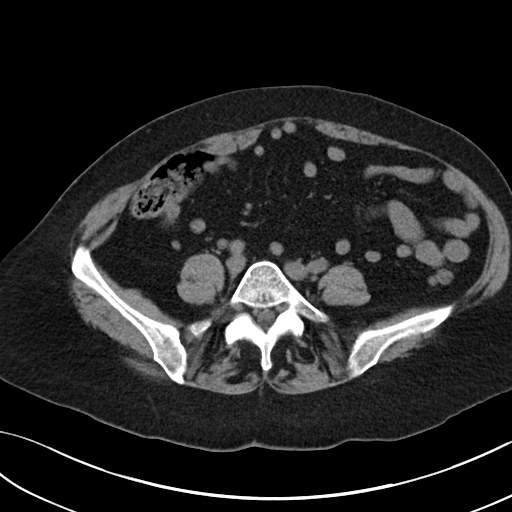
[im 45/90  soft-tissue]
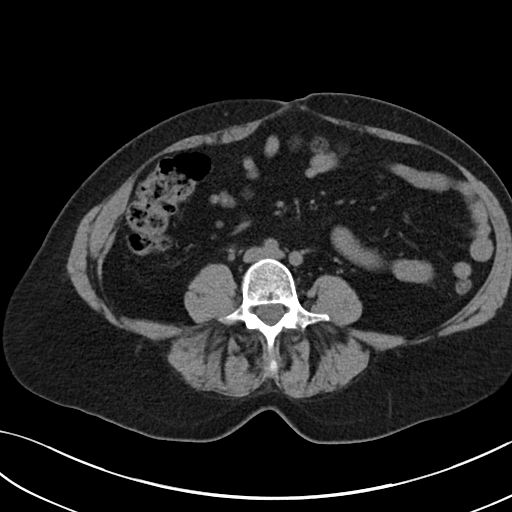
[im 52/90  soft-tissue]
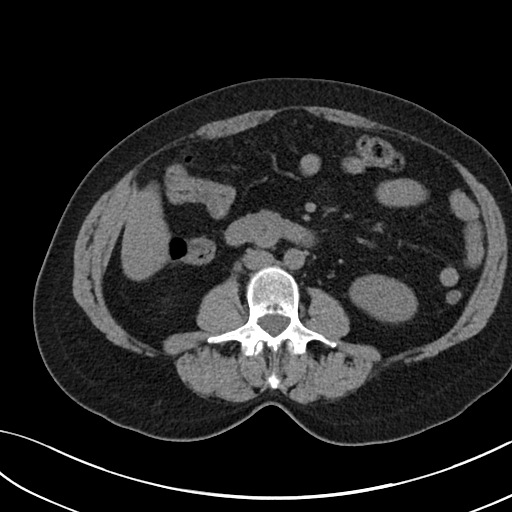
[im 60/90  soft-tissue]
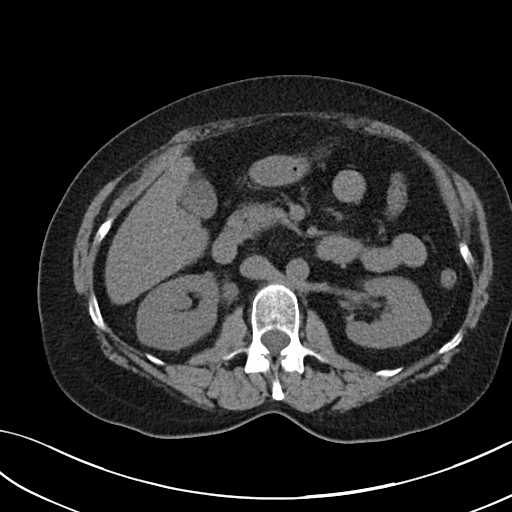
[im 60/90  bone]
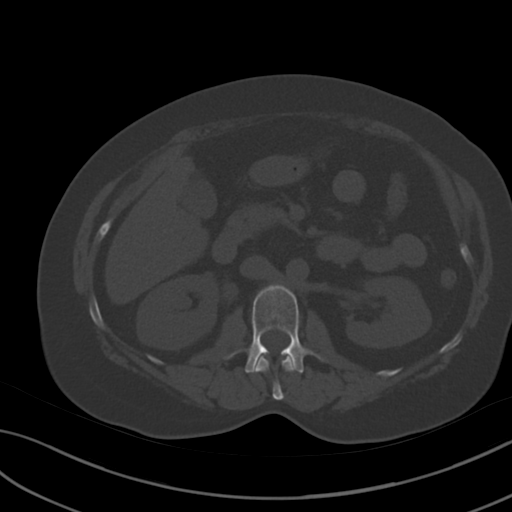
[im 64/90  soft-tissue]
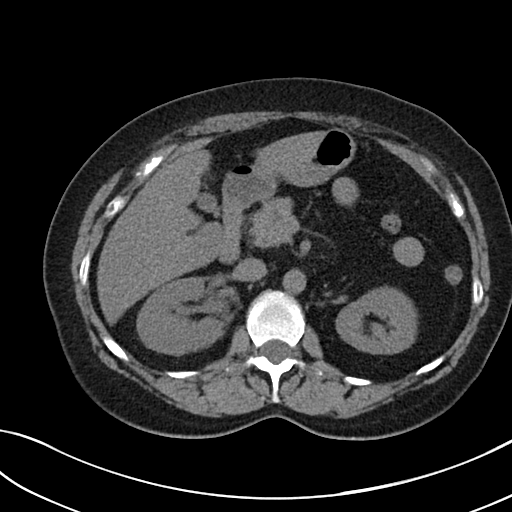
[im 71/90  soft-tissue]
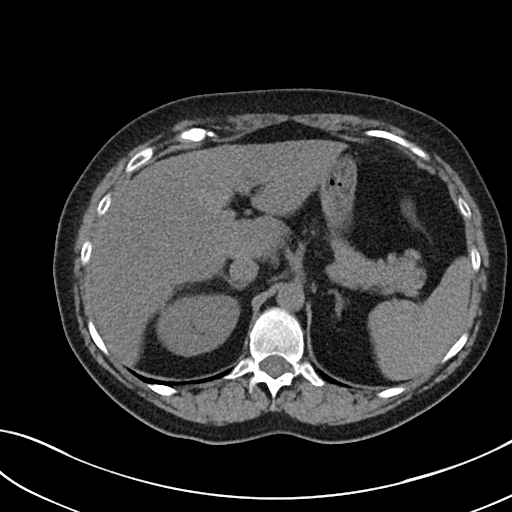
[im 78/90  soft-tissue]
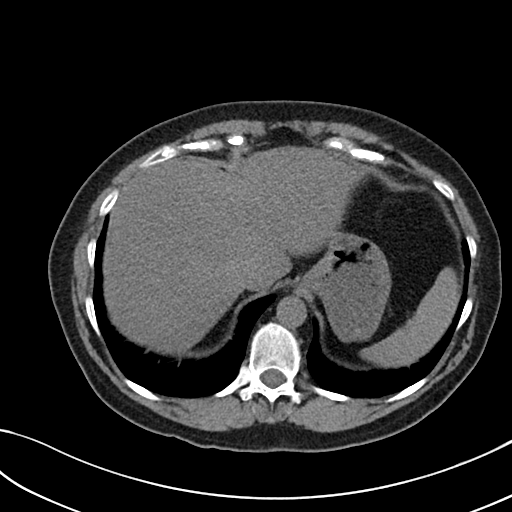
[im 86/90  soft-tissue]
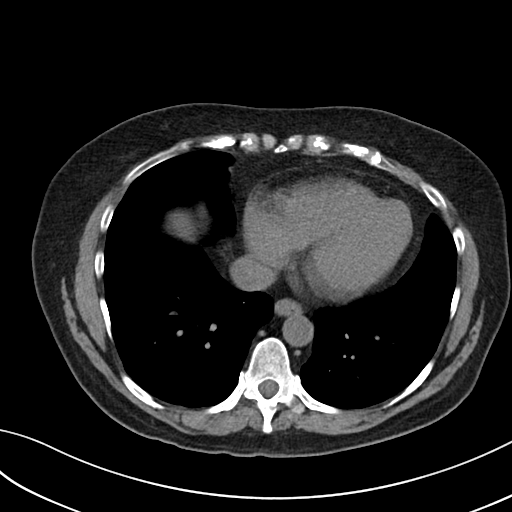

[Series 5: renal stone 3.0 cor · coronal · 0.78mm/px · 3 of 101 slices shown]
[im 34/101  soft-tissue]
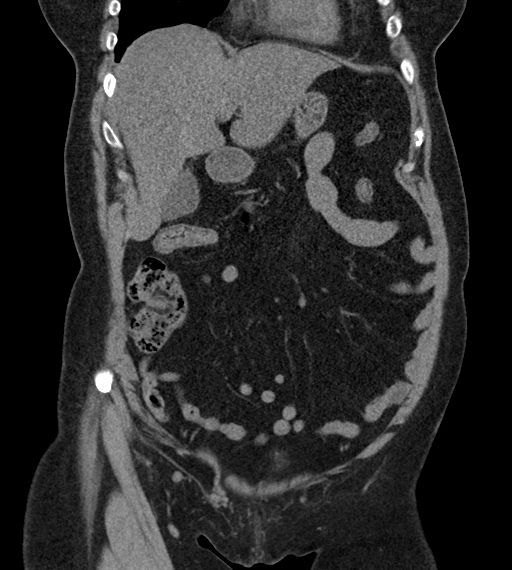
[im 45/101  soft-tissue]
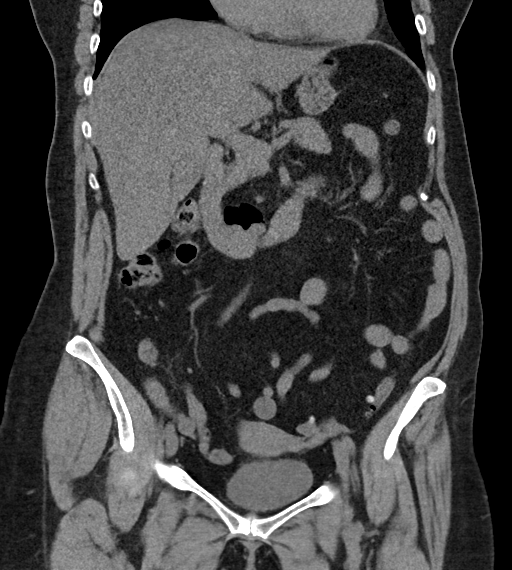
[im 56/101  soft-tissue]
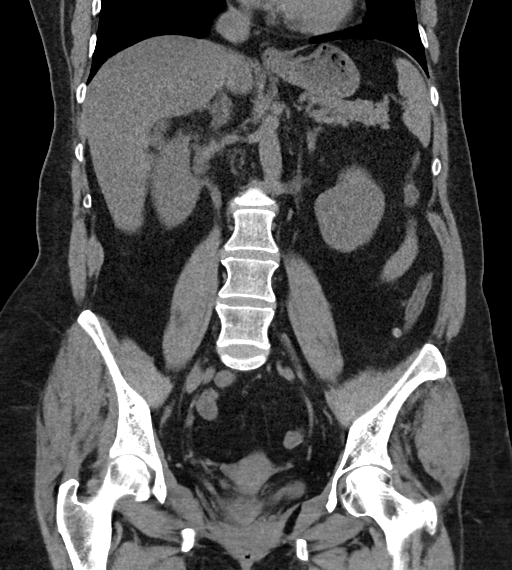

[16 of 46 positions shown; findings below may reference images not displayed]

FINDINGS: Lower chest: Minimal dependent atelectasis is present in the lung
bases. No pleural or pericardial effusion.

Hepatobiliary: There is diffuse fatty infiltration of the liver. No
focal lesion is identified. The gallbladder and biliary tree appear
normal.

Pancreas: Unremarkable. No pancreatic ductal dilatation or
surrounding inflammatory changes.

Spleen: Normal in size without focal abnormality.

Adrenals/Urinary Tract: There is mild to moderate right
hydronephrosis with stranding about the right kidney due to a
punctate stone measuring 1-2 mm in the distal right ureter
approximately 3 cm proximal to the UVJ. Two punctate nonobstructing
stones are seen in the mid and lower pole of the left kidney. No
other urinary tract stones. Urinary bladder and adrenal glands are
normal.

Stomach/Bowel: A few colonic diverticula are identified. No
diverticulitis. The appendix has been removed. The stomach appears
normal. Duodenal diverticulum is noted. Small bowel is otherwise
unremarkable.

Vascular/Lymphatic: No atherosclerosis or lymphadenopathy.
Retroaortic left renal vein noted.

Reproductive: Uterus and bilateral adnexa are unremarkable.

Other: No fluid collection.

Musculoskeletal: Negative.
IMPRESSION: Mild to moderate right hydronephrosis due to a punctate stone
approximately 3 cm proximal to the UVJ.

Two punctate nonobstructing stones are seen the lower pole of the
left kidney.

Fatty infiltration of the liver.

Diverticulosis without diverticulitis.

## 2018-04-09 ENCOUNTER — Other Ambulatory Visit: Payer: Self-pay | Admitting: Allergy and Immunology

## 2018-04-09 DIAGNOSIS — J3089 Other allergic rhinitis: Secondary | ICD-10-CM

## 2018-04-11 ENCOUNTER — Other Ambulatory Visit: Payer: Self-pay | Admitting: Allergy and Immunology

## 2018-04-11 DIAGNOSIS — J3089 Other allergic rhinitis: Secondary | ICD-10-CM

## 2018-04-22 ENCOUNTER — Other Ambulatory Visit: Payer: Self-pay | Admitting: Allergy and Immunology

## 2018-04-22 DIAGNOSIS — J3089 Other allergic rhinitis: Secondary | ICD-10-CM

## 2018-04-23 ENCOUNTER — Telehealth: Payer: Self-pay | Admitting: Allergy and Immunology

## 2018-04-23 NOTE — Telephone Encounter (Signed)
Pt called and needs a rx of Dymsta called into [facility] on [address] [phone].

## 2018-04-23 NOTE — Telephone Encounter (Signed)
Called patient advised we cannot send in refill as we have not seen her since 10/2015. Patient states she has an appt in May 2020. Advised we would send in when she comes in or she can get her pcp to send it in. Patient verbalized understanding

## 2018-06-09 ENCOUNTER — Ambulatory Visit (INDEPENDENT_AMBULATORY_CARE_PROVIDER_SITE_OTHER): Payer: BLUE CROSS/BLUE SHIELD | Admitting: Allergy and Immunology

## 2018-06-09 ENCOUNTER — Telehealth: Payer: Self-pay | Admitting: Allergy and Immunology

## 2018-06-09 ENCOUNTER — Other Ambulatory Visit: Payer: Self-pay

## 2018-06-09 ENCOUNTER — Encounter: Payer: Self-pay | Admitting: Allergy and Immunology

## 2018-06-09 DIAGNOSIS — R053 Chronic cough: Secondary | ICD-10-CM

## 2018-06-09 DIAGNOSIS — R05 Cough: Secondary | ICD-10-CM | POA: Diagnosis not present

## 2018-06-09 DIAGNOSIS — J3089 Other allergic rhinitis: Secondary | ICD-10-CM

## 2018-06-09 MED ORDER — AZELASTINE-FLUTICASONE 137-50 MCG/ACT NA SUSP: 1.0000 | Freq: Two times a day (BID) | NASAL | 5 refills | Status: DC | PRN

## 2018-06-09 NOTE — Assessment & Plan Note (Signed)
Stable.  Continue allergen avoidance measures and Dymista nasal spray as needed and nasal saline irrigation as needed.  For thick post nasal drainage, add guaifenesin [phone] mg (Mucinex)  twice daily as needed with adequate hydration as discussed.

## 2018-06-09 NOTE — Patient Instructions (Signed)
Perennial and seasonal allergic rhinoconjunctivitis Stable.  Continue allergen avoidance measures and Dymista nasal spray as needed and nasal saline irrigation as needed.  For thick post nasal drainage, add guaifenesin [phone] mg (Mucinex)  twice daily as needed with adequate hydration as discussed.  Persistent cough  Treatment plan as outlined above.   Return in about 1 year (around 06/09/2019), or if symptoms worsen or fail to improve.

## 2018-06-09 NOTE — Assessment & Plan Note (Signed)
   Treatment plan as outlined above. 

## 2018-06-09 NOTE — Telephone Encounter (Signed)
Skin test printed and mailed to patient's home.

## 2018-06-09 NOTE — Progress Notes (Signed)
Follow-up Telemedicine Note  RE: Tiffany Cunningham MRN: [id number] DOB: Feb 20, 1955 Date of Telemedicine Visit: 06/09/2018  Primary care provider: Ladora DanielBeal, Sheri, PA-C Referring provider: Ladora DanielBeal, Sheri, PA-C  Telemedicine Follow Up Visit via Telephone: I connected with Tiffany Cunningham for a follow up on 06/09/18 by telephone and verified that I am speaking with the correct person using two identifiers.   The limitations, risks, security and privacy concerns of performing an evaluation and management service by telemedicine, the availability of in person appointments, and that there may be a patient responsible charge related to this service were discussed. The patient expressed understanding and agreed to proceed.  Patient is at home.  Provider is at the office.  Visit start time: 11:13 AM Visit end time: 11:23 AM Insurance consent/check in by: Hilda LiasMarie Medical consent and medical assistant/nurse: Herbert SetaHeather  History of present illness: Tiffany Cunningham is a 63 y.o. female with perennial and seasonal allergic rhinoconjunctivitis and history of persistent cough presenting today via telemedicine for follow-up.  She was last seen in this clinic in September 2017.  She reports that her nasal allergy symptoms and cough are typically well controlled with nasal saline spray and/or guaifenesin.  On occasion, she will add Dymista to help control the symptoms.  She has no complaints today.  She needs a refill for Dymista.  Assessment and plan: Perennial and seasonal allergic rhinoconjunctivitis Stable.  Continue allergen avoidance measures and Dymista nasal spray as needed and nasal saline irrigation as needed.  For thick post nasal drainage, add guaifenesin [phone] mg (Mucinex)  twice daily as needed with adequate hydration as discussed.  Persistent cough  Treatment plan as outlined above.   Meds ordered this encounter  Medications  . Azelastine-Fluticasone 137-50 MCG/ACT SUSP    Sig: Place 1 spray  into the nose 2 (two) times daily as needed.    Dispense:  1 Bottle    Refill:  5    Diagnostics: None.   Physical examination: Physical Exam Not obtained as encounter was done via telephone.   The following portions of the patient's history were reviewed and updated as appropriate: allergies, current medications, past family history, past medical history, past social history, past surgical history and problem list.  Allergies as of 06/09/2018      Reactions   Ace Inhibitors Cough      Medication List       Accurate as of Jun 09, 2018 12:55 PM. Always use your most recent med list.        acetaminophen 325 MG tablet Commonly known as:  TYLENOL Take 325-650 mg by mouth every 6 (six) hours as needed (for pain).   Azelastine-Fluticasone 137-50 MCG/ACT Susp Place 1 spray into the nose 2 (two) times daily as needed.   Fish Oil 1000 MG Caps Take 1 capsule by mouth daily.   Guaifenesin 1200 MG Tb12 Take 1 tablet by mouth 2 (two) times daily as needed.   losartan-hydrochlorothiazide 100-25 MG tablet Commonly known as:  HYZAAR Take 1 tablet by mouth daily.   magnesium oxide 400 MG tablet Commonly known as:  MAG-OX Take 400 mg by mouth daily.   meloxicam 15 MG tablet Commonly known as:  MOBIC   multivitamin with minerals Tabs tablet Take 1 tablet by mouth daily.   Propylene Glycol 0.6 % Soln Place 1 drop into both eyes daily.       Allergies  Allergen Reactions  . Ace Inhibitors Cough    Previous notes and tests were  reviewed.  I discussed the assessment and treatment plan with the patient. The patient was provided an opportunity to ask questions and all were answered. The patient agreed with the plan and demonstrated an understanding of the instructions.   The patient was advised to call back or seek an in-person evaluation if the symptoms worsen or if the condition fails to improve as anticipated.  I provided 10 minutes of non-face-to-face time during this  encounter.  I appreciate the opportunity to take part in Tiffany Cunningham's care. Please do not hesitate to contact me with questions.  Sincerely,   R. Jorene Guest, MD

## 2018-06-09 NOTE — Telephone Encounter (Signed)
Patient had a visit with Dr. Nunzio Cobbs, today, 06-09-2018. She forgot to ask for a printed out list of what she was allergic to when she had her testing in 2017.

## 2019-06-15 ENCOUNTER — Ambulatory Visit (INDEPENDENT_AMBULATORY_CARE_PROVIDER_SITE_OTHER): Payer: 59 | Admitting: Allergy and Immunology

## 2019-06-15 ENCOUNTER — Encounter: Payer: Self-pay | Admitting: Allergy and Immunology

## 2019-06-15 ENCOUNTER — Other Ambulatory Visit: Payer: Self-pay

## 2019-06-15 DIAGNOSIS — R05 Cough: Secondary | ICD-10-CM

## 2019-06-15 DIAGNOSIS — J3089 Other allergic rhinitis: Secondary | ICD-10-CM

## 2019-06-15 DIAGNOSIS — R053 Chronic cough: Secondary | ICD-10-CM

## 2019-06-15 MED ORDER — AZELASTINE-FLUTICASONE 137-50 MCG/ACT NA SUSP: 1.0000 | Freq: Two times a day (BID) | NASAL | 5 refills | Status: DC | PRN

## 2019-06-15 NOTE — Progress Notes (Signed)
    Follow-up Note  RE: Tiffany Cunningham MRN: [id number] DOB: 03/21/1955 Date of Office Visit: 06/15/2019  Primary care provider: Ladora Daniel, PA-C Referring provider: Ladora Daniel, PA-C  History of present illness: Tiffany Cunningham is a 64 y.o. female with perennial and seasonal allergic rhinoconjunctivitis and history of persistent cough presenting today for follow-up.  She was last evaluated via telemedicine in May 2020.  She reports that she has only been requiring Dymista nasal spray sporadically as needed.  She reports that if she starts to develop a cough, she will start using guaifenesin and, if needed will add Dymista.  She has only required Dymista for approximately 1 week over the past couple months.  She needs a refill for the Dymista today.  Assessment and plan: Perennial and seasonal allergic rhinoconjunctivitis Stable.  Continue allergen avoidance measures and Dymista nasal spray as needed and nasal saline irrigation as needed.  A refill prescription has been provided.  For thick post nasal drainage, add guaifenesin [phone] mg (Mucinex)  twice daily as needed with adequate hydration as discussed.  Persistent cough Improved.  Treatment plan as outlined above.   Meds ordered this encounter  Medications  . Azelastine-Fluticasone 137-50 MCG/ACT SUSP    Sig: Place 1 spray into the nose 2 (two) times daily as needed.    Dispense:  23 g    Refill:  5    Diagnostics: None.    Physical examination: Blood pressure 118/80, pulse 70, temperature (!) 97.3 F (36.3 C), temperature source Temporal, resp. rate 16, height 5' 4.5" (1.638 m), weight 164 lb (74.4 kg), SpO2 96 %.  General: Alert, interactive, in no acute distress. HEENT: TMs pearly gray, turbinates mildly edematous without discharge, post-pharynx mildly erythematous. Neck: Supple without lymphadenopathy. Lungs: Clear to auscultation without wheezing, rhonchi or rales. CV: Normal S1, S2 without  murmurs. Skin: Warm and dry, without lesions or rashes.  The following portions of the patient's history were reviewed and updated as appropriate: allergies, current medications, past family history, past medical history, past social history, past surgical history and problem list.  Current Outpatient Medications  Medication Sig Dispense Refill  . acetaminophen (TYLENOL) 325 MG tablet Take 325-650 mg by mouth every 6 (six) hours as needed (for pain).    Marland Kitchen amLODipine (NORVASC) 5 MG tablet Take 5 mg by mouth daily.    . Azelastine-Fluticasone 137-50 MCG/ACT SUSP Place 1 spray into the nose 2 (two) times daily as needed. 23 g 5  . Guaifenesin 1200 MG TB12 Take 1 tablet by mouth 2 (two) times daily as needed.    Marland Kitchen losartan-hydrochlorothiazide (HYZAAR) 100-25 MG tablet Take 1 tablet by mouth daily.    . magnesium oxide (MAG-OX) 400 MG tablet Take 400 mg by mouth daily.    . Multiple Vitamin (MULTIVITAMIN WITH MINERALS) TABS tablet Take 1 tablet by mouth daily.    . Omega-3 Fatty Acids (FISH OIL) 1000 MG CAPS Take 1 capsule by mouth daily.    Marland Kitchen Propylene Glycol 0.6 % SOLN Place 1 drop into both eyes daily.    . meloxicam (MOBIC) 15 MG tablet      No current facility-administered medications for this visit.    Allergies  Allergen Reactions  . Ace Inhibitors Cough    I appreciate the opportunity to take part in Kameren's care. Please do not hesitate to contact me with questions.  Sincerely,   R. Jorene Guest, MD

## 2019-06-15 NOTE — Assessment & Plan Note (Signed)
Stable.  Continue allergen avoidance measures and Dymista nasal spray as needed and nasal saline irrigation as needed.  A refill prescription has been provided.  For thick post nasal drainage, add guaifenesin [phone] mg (Mucinex)  twice daily as needed with adequate hydration as discussed.

## 2019-06-15 NOTE — Assessment & Plan Note (Signed)
Improved.  Treatment plan as outlined above. 

## 2019-06-15 NOTE — Patient Instructions (Addendum)
Perennial and seasonal allergic rhinoconjunctivitis Stable.  Continue allergen avoidance measures and Dymista nasal spray as needed and nasal saline irrigation as needed.  A refill prescription has been provided.  For thick post nasal drainage, add guaifenesin [phone] mg (Mucinex)  twice daily as needed with adequate hydration as discussed.  Persistent cough Improved.  Treatment plan as outlined above.   Return in about 1 year (around 06/14/2020), or if symptoms worsen or fail to improve.

## 2020-01-05 ENCOUNTER — Ambulatory Visit: Payer: No Typology Code available for payment source | Admitting: Cardiology

## 2020-01-05 ENCOUNTER — Encounter: Payer: Self-pay | Admitting: Cardiology

## 2020-01-05 ENCOUNTER — Other Ambulatory Visit: Payer: Self-pay

## 2020-01-05 VITALS — BP 96/62 | HR 71 | Ht 64.0 in | Wt 152.0 lb

## 2020-01-05 DIAGNOSIS — I10 Essential (primary) hypertension: Secondary | ICD-10-CM | POA: Diagnosis not present

## 2020-01-05 DIAGNOSIS — I341 Nonrheumatic mitral (valve) prolapse: Secondary | ICD-10-CM | POA: Diagnosis not present

## 2020-01-05 DIAGNOSIS — R002 Palpitations: Secondary | ICD-10-CM

## 2020-01-05 DIAGNOSIS — R252 Cramp and spasm: Secondary | ICD-10-CM | POA: Diagnosis not present

## 2020-01-05 NOTE — Progress Notes (Signed)
Cardiology Office Note:    Date:  01/05/2020   ID:  Tiffany Cunningham, DOB 1955/03/14, MRN [id number]  PCP:  Ladora Daniel, PA-C  [facility] Cardiologist:  Donato Schultz, MD  [facility] Electrophysiologist:  None   Referring MD: Ladora Daniel, PA-C    History of Present Illness:    Tiffany Cunningham is a 64 y.o. female here for the follow-up of palpitations, heart murmur, essential hypertension.  Previously she has been described as having mitral valve prolapse.  Last echocardiogram in 2017 did not demonstrate any significant prolapse involved.  Prior palpitations were likely PVCs or PACs and conservative management strategy was needed.  Echocardiogram previously reviewed demonstrates no obvious valvular abnormalities however she does have vigorous contraction with mild focal basal septal hypertrophy which is likely causing turbulent flow and murmur.  Leg cramps were also improved with magnesium.  Since her 20 pound weight loss, Cymbalta, she is doing better.  Her blood pressure is fairly low today.  See below for changes.  Denies any dizziness fevers chills nausea vomiting syncope.  Past Medical History:  Diagnosis Date  . Urticaria     Past Surgical History:  Procedure Laterality Date  . APPENDECTOMY  1979  . OSTEOTOMY  1993    Current Medications: Current Meds  Medication Sig  . acetaminophen (TYLENOL) 325 MG tablet Take 325-650 mg by mouth every 6 (six) hours as needed (for pain).  . Azelastine-Fluticasone 137-50 MCG/ACT SUSP Place 1 spray into the nose 2 (two) times daily as needed.  . baclofen (LIORESAL) 10 MG tablet Take by mouth.  . DULoxetine (CYMBALTA) 60 MG capsule Take 1 capsule by mouth daily.  . Guaifenesin 1200 MG TB12 Take 1 tablet by mouth 2 (two) times daily as needed.  Marland Kitchen losartan-hydrochlorothiazide (HYZAAR) 100-25 MG tablet Take 1 tablet by mouth daily.  . magnesium oxide (MAG-OX) 400 MG tablet Take 400 mg by mouth daily.  . meloxicam (MOBIC) 15 MG tablet    . Multiple Vitamin (MULTIVITAMIN WITH MINERALS) TABS tablet Take 1 tablet by mouth daily.  . Omega-3 Fatty Acids (FISH OIL) 1000 MG CAPS Take 1 capsule by mouth daily.  Marland Kitchen Propylene Glycol 0.6 % SOLN Place 1 drop into both eyes daily.  . [DISCONTINUED] amLODipine (NORVASC) 5 MG tablet Take 5 mg by mouth daily.     Allergies:   Ace inhibitors   Social History   Socioeconomic History  . Marital status: Married    Spouse name: Not on file  . Number of children: Not on file  . Years of education: Not on file  . Highest education level: Not on file  Occupational History  . Not on file  Tobacco Use  . Smoking status: Never Smoker  . Smokeless tobacco: Never Used  Vaping Use  . Vaping Use: Never used  Substance and Sexual Activity  . Alcohol use: No    Alcohol/week: 0.0 standard drinks  . Drug use: No  . Sexual activity: Not on file  Other Topics Concern  . Not on file  Social History Narrative  . Not on file   Social Determinants of Health   Financial Resource Strain:   . Difficulty of Paying Living Expenses: Not on file  Food Insecurity:   . Worried About Programme researcher, broadcasting/film/video in the Last Year: Not on file  . Ran Out of Food in the Last Year: Not on file  Transportation Needs:   . Lack of Transportation (Medical): Not on file  . Lack  of Transportation (Non-Medical): Not on file  Physical Activity:   . Days of Exercise per Week: Not on file  . Minutes of Exercise per Session: Not on file  Stress:   . Feeling of Stress : Not on file  Social Connections:   . Frequency of Communication with Friends and Family: Not on file  . Frequency of Social Gatherings with Friends and Family: Not on file  . Attends Religious Services: Not on file  . Active Member of Clubs or Organizations: Not on file  . Attends Banker Meetings: Not on file  . Marital Status: Not on file     Family History: The patient's family history includes Allergic rhinitis in her mother and  sister; Asthma in her cousin and sister; Eczema in her daughter. There is no history of Urticaria or Immunodeficiency.  ROS:   Please see the history of present illness.    Denies any fevers chills nausea vomiting syncope bleeding all other systems reviewed and are negative.  EKGs/Labs/Other Studies Reviewed:    The following studies were reviewed today:  Echocardiogram on 04/01/2015: - Left ventricle: The cavity size was normal. There was mild focal basal hypertrophy of the septum. Systolic function was vigorous. The estimated ejection fraction was in the range of 65% to 70%. Wall motion was normal; there were no regional wall motion abnormalities. Doppler parameters are consistent with abnormal left ventricular relaxation (grade 1 diastolic dysfunction). Doppler parameters are consistent with high ventricular filling pressure. - Aortic valve: Transvalvular velocity was within the normal range. There was no stenosis. There was no regurgitation. - Mitral valve: Transvalvular velocity was within the normal range. There was no evidence for stenosis. There was no regurgitation. - Left atrium: The atrium was mildly dilated. - Right ventricle: The cavity size was normal. Wall thickness was normal. Systolic function was normal. - Tricuspid valve: There was trivial regurgitation. - Pulmonary arteries: Systolic pressure was within the normal range. PA peak pressure: 31 mm Hg (S). - Inferior vena cava: The vessel was normal in size. - Pericardium, extracardiac: A small pericardial effusion was identified. Features were not consistent with tamponade physiology.  EKG:  EKG is  ordered today.  The ekg ordered today demonstrates sinus rhythm 71 no other changes.  Recent Labs: No results found for requested labs within last 8760 hours.  Recent Lipid Panel No results found for: CHOL, TRIG, HDL, CHOLHDL, VLDL, LDLCALC, LDLDIRECT   Risk Assessment/Calculations:         Physical Exam:    VS:  BP 96/62   Pulse 71   Ht 5\' 4"  (1.626 m)   Wt 152 lb (68.9 kg)   SpO2 95%   BMI 26.09 kg/m     Wt Readings from Last 3 Encounters:  01/05/20 152 lb (68.9 kg)  06/15/19 164 lb (74.4 kg)  01/15/18 173 lb 3.2 oz (78.6 kg)     GEN:  Well nourished, well developed in no acute distress HEENT: Normal NECK: No JVD; No carotid bruits LYMPHATICS: No lymphadenopathy CARDIAC: RRR, 1/6 systolic murmur, no rubs, gallops RESPIRATORY:  Clear to auscultation without rales, wheezing or rhonchi  ABDOMEN: Soft, non-tender, non-distended MUSCULOSKELETAL:  No edema; No deformity  SKIN: Warm and dry NEUROLOGIC:  Alert and oriented x 3 PSYCHIATRIC:  Normal affect   ASSESSMENT:    1. Palpitations   2. Mitral valve prolapse   3. Essential hypertension   4. Leg cramps    PLAN:    In order of problems listed  above:  Palpitations -Conservative management strategy likely PVCs and PACs.  Try to avoid stimulants, excessive caffeine.  Doing well.  Mitral valve prolapse -No significant prolapse was seen on last echocardiogram in 2017.  Murmur was heard and likely secondary to accelerated blood flow through outflow tract.  Essential hypertension -On losartan hydrochlorothiazide combination.  Amlodipine 5 mg was started since last visit and in fact her blood pressure is low today 96/62.  She is not feeling dizziness with this.  I will go ahead and stop her amlodipine 5 mg.  She does have an upcoming PCP appointment soon.  Obviously if her blood pressure is back elevated she can always restart.  She is also lost 20 pounds over the last 2 years.  Cymbalta has helped with depression and also as a side effect helped with weight loss.  Her joint pain seems to be improving.  Continue medical management.  Continue with good optimal control.  Leg cramps -Magnesium 400 mg tablet daily.  2-year follow-up.   Medication Adjustments/Labs and Tests Ordered: Current medicines are  reviewed at length with the patient today.  Concerns regarding medicines are outlined above.  Orders Placed This Encounter  Procedures  . EKG 12-Lead   No orders of the defined types were placed in this encounter.   Patient Instructions  Medication Instructions:  Please discontinue your Amlodipine. Continue all other medications as listed.  *If you need a refill on your cardiac medications before your next appointment, please call your pharmacy*  Follow-Up: At [facility], you and your health needs are our priority.  As part of our continuing mission to provide you with exceptional heart care, we have created designated Provider Care Teams.  These Care Teams include your primary Cardiologist (physician) and Advanced Practice Providers (APPs -  Physician Assistants and Nurse Practitioners) who all work together to provide you with the care you need, when you need it.  We recommend signing up for the patient portal called "MyChart".  Sign up information is provided on this After Visit Summary.  MyChart is used to connect with patients for Virtual Visits (Telemedicine).  Patients are able to view lab/test results, encounter notes, upcoming appointments, etc.  Non-urgent messages can be sent to your provider as well.   To learn more about what you can do with MyChart, go to [email or web address].    Your next appointment:   2 year(s)  The format for your next appointment:   In Person  Provider:   Donato Schultz, MD  Thank you for choosing [facility]!!        Signed, Donato Schultz, MD  01/05/2020 1:39 PM    [facility]

## 2020-01-05 NOTE — Patient Instructions (Signed)
Medication Instructions:  Please discontinue your Amlodipine. Continue all other medications as listed.  *If you need a refill on your cardiac medications before your next appointment, please call your pharmacy*  Follow-Up: At [facility], you and your health needs are our priority.  As part of our continuing mission to provide you with exceptional heart care, we have created designated Provider Care Teams.  These Care Teams include your primary Cardiologist (physician) and Advanced Practice Providers (APPs -  Physician Assistants and Nurse Practitioners) who all work together to provide you with the care you need, when you need it.  We recommend signing up for the patient portal called "MyChart".  Sign up information is provided on this After Visit Summary.  MyChart is used to connect with patients for Virtual Visits (Telemedicine).  Patients are able to view lab/test results, encounter notes, upcoming appointments, etc.  Non-urgent messages can be sent to your provider as well.   To learn more about what you can do with MyChart, go to [email or web address].    Your next appointment:   2 year(s)  The format for your next appointment:   In Person  Provider:   Donato Schultz, MD  Thank you for choosing [facility]!!

## 2020-06-12 NOTE — Progress Notes (Signed)
Follow Up Note  RE: Tiffany Cunningham MRN: [id number] DOB: [birth year] Date of Office Visit: 06/13/2020  Referring provider: Ladora Daniel, PA-C Primary care provider: Ladora Daniel, PA-C  Chief Complaint: Cough (Minor cough nasal spray has helped ) and Allergic Rhinitis  (Has been taking her allergy medication and states wearing a mask has been helpful )  History of Present Illness: I had the pleasure of seeing Tiffany Cunningham for a follow up visit at the [facility] on 06/13/2020. She is a 65 y.o. female, who is being followed for allergic rhinoconjunctivitis. Her previous allergy office visit was on 06/15/2019 with Dr. Nunzio Cobbs. Today is a regular follow up visit.  Perennial and seasonal allergic rhinoconjunctivitis Patient using dymista 1 spray per nostril 1-2 times per day for a few weeks in the spring and fall with good benefit. No nosebleeds.  This has helped the cough tremendously.  Takes zyrtec as needed.  Has dry eyes and uses eye drops for that.   Assessment and Plan: Tiffany Cunningham is a 65 y.o. female with: Seasonal and perennial allergic rhinoconjunctivitis Past history - 2017 skin testing was positive to trees, grass, weed, ragweed, mold and dust mites. Interim history - flares in the spring and fall and using Dymista helps tremendously.   Continue environmental control measures  May use over the counter antihistamines such as Zyrtec (cetirizine), Claritin (loratadine), Allegra (fexofenadine), or Xyzal (levocetirizine) daily as needed. May use dymista (fluticasone + azelastine nasal spray combination) 1 spray per nostril twice a day as needed.  Nasal saline spray (i.e., Simply Saline) or nasal saline lavage (i.e., NeilMed) is recommended as needed and prior to medicated nasal sprays.  Return in about 1 year (around 06/13/2021).  Meds ordered this encounter  Medications  . Azelastine-Fluticasone 137-50 MCG/ACT SUSP    Sig: Place 1 spray into the nose 2 (two) times daily  as needed.    Dispense:  23 g    Refill:  5   Lab Orders  No laboratory test(s) ordered today    Diagnostics: None.  Medication List:  Current Outpatient Medications  Medication Sig Dispense Refill  . acetaminophen (TYLENOL) 325 MG tablet Take 325-650 mg by mouth every 6 (six) hours as needed (for pain).    . baclofen (LIORESAL) 10 MG tablet Take by mouth.    . DULoxetine (CYMBALTA) 60 MG capsule Take 1 capsule by mouth daily.    . Guaifenesin 1200 MG TB12 Take 1 tablet by mouth 2 (two) times daily as needed.    Marland Kitchen losartan-hydrochlorothiazide (HYZAAR) 100-25 MG tablet Take 1 tablet by mouth daily.    . magnesium oxide (MAG-OX) 400 MG tablet Take 400 mg by mouth daily.    . meloxicam (MOBIC) 15 MG tablet     . Multiple Vitamin (MULTIVITAMIN WITH MINERALS) TABS tablet Take 1 tablet by mouth daily.    . Omega-3 Fatty Acids (FISH OIL) 1000 MG CAPS Take 1 capsule by mouth daily.    Marland Kitchen Propylene Glycol 0.6 % SOLN Place 1 drop into both eyes daily.    . Azelastine-Fluticasone 137-50 MCG/ACT SUSP Place 1 spray into the nose 2 (two) times daily as needed. 23 g 5   No current facility-administered medications for this visit.   Allergies: Allergies  Allergen Reactions  . Ace Inhibitors Cough   I reviewed her past medical history, social history, family history, and environmental history and no significant changes have been reported from her previous visit.  Review of Systems  Constitutional:  Negative for appetite change, chills, fever and unexpected weight change.  HENT: Negative for congestion and rhinorrhea.   Eyes: Negative for itching.  Respiratory: Positive for cough. Negative for chest tightness, shortness of breath and wheezing.   Gastrointestinal: Negative for abdominal pain.  Skin: Negative for rash.  Allergic/Immunologic: Positive for environmental allergies.  Neurological: Negative for headaches.   Objective: Temp 98.7 F (37.1 C)   Ht 5\' 4"  (1.626 m)   Wt 160 lb 6.4  oz (72.8 kg)   BMI 27.53 kg/m  Body mass index is 27.53 kg/m. Physical Exam Vitals and nursing note reviewed.  Constitutional:      Appearance: Normal appearance. She is well-developed.  HENT:     Head: Normocephalic and atraumatic.     Right Ear: External ear normal. There is impacted cerumen.     Left Ear: External ear normal. There is impacted cerumen.     Nose: Nose normal.     Mouth/Throat:     Mouth: Mucous membranes are moist.     Pharynx: Oropharynx is clear.  Eyes:     Conjunctiva/sclera: Conjunctivae normal.  Cardiovascular:     Rate and Rhythm: Normal rate and regular rhythm.     Heart sounds: Normal heart sounds. No murmur heard.   Pulmonary:     Effort: Pulmonary effort is normal.     Breath sounds: Normal breath sounds. No wheezing, rhonchi or rales.  Musculoskeletal:     Cervical back: Neck supple.  Skin:    General: Skin is warm.     Findings: No rash.  Neurological:     Mental Status: She is alert and oriented to person, place, and time.  Psychiatric:        Behavior: Behavior normal.    Previous notes and tests were reviewed. The plan was reviewed with the patient/family, and all questions/concerned were addressed.  It was my pleasure to see Tiffany Cunningham today and participate in her care. Please feel free to contact me with any questions or concerns.  Sincerely,  Darl Pikes, DO Allergy & Immunology  [facility] office: [phone] [facility] office: [phone]

## 2020-06-13 ENCOUNTER — Encounter: Payer: Self-pay | Admitting: Allergy

## 2020-06-13 ENCOUNTER — Ambulatory Visit: Payer: No Typology Code available for payment source | Admitting: Allergy

## 2020-06-13 ENCOUNTER — Other Ambulatory Visit: Payer: Self-pay

## 2020-06-13 VITALS — Temp 98.7°F | Ht 64.0 in | Wt 160.4 lb

## 2020-06-13 DIAGNOSIS — H1013 Acute atopic conjunctivitis, bilateral: Secondary | ICD-10-CM

## 2020-06-13 DIAGNOSIS — J302 Other seasonal allergic rhinitis: Secondary | ICD-10-CM

## 2020-06-13 DIAGNOSIS — H1010 Acute atopic conjunctivitis, unspecified eye: Secondary | ICD-10-CM

## 2020-06-13 DIAGNOSIS — J3089 Other allergic rhinitis: Secondary | ICD-10-CM

## 2020-06-13 MED ORDER — AZELASTINE-FLUTICASONE 137-50 MCG/ACT NA SUSP: 1.0000 | Freq: Two times a day (BID) | NASAL | 5 refills | Status: DC | PRN

## 2020-06-13 NOTE — Assessment & Plan Note (Signed)
Past history - 2017 skin testing was positive to trees, grass, weed, ragweed, mold and dust mites. Interim history - flares in the spring and fall and using Dymista helps tremendously.   Continue environmental control measures  May use over the counter antihistamines such as Zyrtec (cetirizine), Claritin (loratadine), Allegra (fexofenadine), or Xyzal (levocetirizine) daily as needed. May use dymista (fluticasone + azelastine nasal spray combination) 1 spray per nostril twice a day as needed.  Nasal saline spray (i.e., Simply Saline) or nasal saline lavage (i.e., NeilMed) is recommended as needed and prior to medicated nasal sprays.

## 2020-06-13 NOTE — Patient Instructions (Addendum)
Allergic rhino conjunctivitis   2017 skin testing was positive to trees, grass, weed, ragweed, mold and dust mites.  Continue environmental control measures  May use over the counter antihistamines such as Zyrtec (cetirizine), Claritin (loratadine), Allegra (fexofenadine), or Xyzal (levocetirizine) daily as needed. May use dymista (fluticasone + azelastine nasal spray combination) 1 spray per nostril twice a day as needed.  Nasal saline spray (i.e., Simply Saline) or nasal saline lavage (i.e., NeilMed) is recommended as needed and prior to medicated nasal sprays.  Follow up in 12  months or sooner if needed.   Reducing Pollen Exposure . Pollen seasons: trees (spring), grass (summer) and ragweed/weeds (fall). Marland Kitchen Keep windows closed in your home and car to lower pollen exposure.  Lilian Kapur air conditioning in the bedroom and throughout the house if possible.  . Avoid going out in dry windy days - especially early morning. . Pollen counts are highest between 5 - 10 AM and on dry, hot and windy days.  . Save outside activities for late afternoon or after a heavy rain, when pollen levels are lower.  . Avoid mowing of grass if you have grass pollen allergy. Marland Kitchen Be aware that pollen can also be transported indoors on people and pets.  . Dry your clothes in an automatic dryer rather than hanging them outside where they might collect pollen.  . Rinse hair and eyes before bedtime. Control of House Dust Mite Allergen . Dust mite allergens are a common trigger of allergy and asthma symptoms. While they can be found throughout the house, these microscopic creatures thrive in warm, humid environments such as bedding, upholstered furniture and carpeting. . Because so much time is spent in the bedroom, it is essential to reduce mite levels there.  . Encase pillows, mattresses, and box springs in special allergen-proof fabric covers or airtight, zippered plastic covers.  . Bedding should be washed weekly in  hot water (130 F) and dried in a hot dryer. Allergen-proof covers are available for comforters and pillows that can't be regularly washed.  Reyes Ivan the allergy-proof covers every few months. Minimize clutter in the bedroom. Keep pets out of the bedroom.  Marland Kitchen Keep humidity less than 50% by using a dehumidifier or air conditioning. You can buy a humidity measuring device called a hygrometer to monitor this.  . If possible, replace carpets with hardwood, linoleum, or washable area rugs. If that's not possible, vacuum frequently with a vacuum that has a HEPA filter. . Remove all upholstered furniture and non-washable window drapes from the bedroom. . Remove all non-washable stuffed toys from the bedroom.  Wash stuffed toys weekly. Mold Control . Mold and fungi can grow on a variety of surfaces provided certain temperature and moisture conditions exist.  . Outdoor molds grow on plants, decaying vegetation and soil. The major outdoor mold, Alternaria and Cladosporium, are found in very high numbers during hot and dry conditions. Generally, a late summer - fall peak is seen for common outdoor fungal spores. Rain will temporarily lower outdoor mold spore count, but counts rise rapidly when the rainy period ends. . The most important indoor molds are Aspergillus and Penicillium. Dark, humid and poorly ventilated basements are ideal sites for mold growth. The next most common sites of mold growth are the bathroom and the kitchen. Outdoor (Seasonal) Mold Control . Use air conditioning and keep windows closed. . Avoid exposure to decaying vegetation. Marland Kitchen Avoid leaf raking. . Avoid grain handling. . Consider wearing a face mask if working  in moldy areas.  Indoor (Perennial) Mold Control  . Maintain humidity below 50%. . Get rid of mold growth on hard surfaces with water, detergent and, if necessary, 5% bleach (do not mix with other cleaners). Then dry the area completely. If mold covers an area more than 10 square  feet, consider hiring an indoor environmental professional. . For clothing, washing with soap and water is best. If moldy items cannot be cleaned and dried, throw them away. . Remove sources e.g. contaminated carpets. . Repair and seal leaking roofs or pipes. Using dehumidifiers in damp basements may be helpful, but empty the water and clean units regularly to prevent mildew from forming. All rooms, especially basements, bathrooms and kitchens, require ventilation and cleaning to deter mold and mildew growth. Avoid carpeting on concrete or damp floors, and storing items in damp areas.

## 2020-09-11 ENCOUNTER — Other Ambulatory Visit: Payer: Self-pay

## 2020-09-11 ENCOUNTER — Encounter (HOSPITAL_COMMUNITY): Payer: Self-pay

## 2020-09-11 ENCOUNTER — Ambulatory Visit (HOSPITAL_COMMUNITY)
Admission: EM | Admit: 2020-09-11 | Discharge: 2020-09-11 | Disposition: A | Discharge status: Home / Self Care | Payer: No Typology Code available for payment source | Attending: Medical Oncology | Admitting: Medical Oncology

## 2020-09-11 ENCOUNTER — Ambulatory Visit (INDEPENDENT_AMBULATORY_CARE_PROVIDER_SITE_OTHER): Payer: No Typology Code available for payment source

## 2020-09-11 DIAGNOSIS — M25532 Pain in left wrist: Secondary | ICD-10-CM

## 2020-09-11 DIAGNOSIS — W19XXXA Unspecified fall, initial encounter: Secondary | ICD-10-CM | POA: Diagnosis not present

## 2020-09-11 NOTE — ED Provider Notes (Signed)
[facility]    CSN: [id number] Arrival date & time: 09/11/20  1437      History   Chief Complaint Chief Complaint  Patient presents with   Fall    HPI Tiffany Cunningham is a 65 y.o. female.   HPI  Wrist Pain: Patient reports that she had a FOOSH injury of her left wrist 2 days ago.  Since then she has had left wrist pain.Worse when she extends the arm or lifts up in chair.  She reports that she has a high pain tolerance but has not had any improvement in her discomfort since this time.  Pain rated 6 out of 10 in nature.  No numbness, tingling, swelling of the hands, loss of grip strength.  She has noticed some swelling and bruising on 1 area of her wrist.  She has not been taking anything for pain.  Past Medical History:  Diagnosis Date   Urticaria     Patient Active Problem List   Diagnosis Date Noted   Mitral valve prolapse 03/22/2015   Mitral regurgitation 03/22/2015   Palpitations 03/22/2015   Essential hypertension 03/22/2015   Persistent cough 02/23/2015   Seasonal and perennial allergic rhinoconjunctivitis 02/23/2015    Past Surgical History:  Procedure Laterality Date   APPENDECTOMY  1979   OSTEOTOMY  1993    OB History   No obstetric history on file.      Home Medications    Prior to Admission medications   Medication Sig Start Date End Date Taking? Authorizing Provider  acetaminophen (TYLENOL) 325 MG tablet Take 325-650 mg by mouth every 6 (six) hours as needed (for pain).    [provider]  Azelastine-Fluticasone 137-50 MCG/ACT SUSP Place 1 spray into the nose 2 (two) times daily as needed. 06/13/20   Ellamae Sia, DO  baclofen (LIORESAL) 10 MG tablet Take by mouth. 04/17/19   [provider]  DULoxetine (CYMBALTA) 60 MG capsule Take 1 capsule by mouth daily. 10/23/19   [provider]  Guaifenesin 1200 MG TB12 Take 1 tablet by mouth 2 (two) times daily as needed.    [provider]   losartan-hydrochlorothiazide (HYZAAR) 100-25 MG tablet Take 1 tablet by mouth daily.    [provider]  magnesium oxide (MAG-OX) 400 MG tablet Take 400 mg by mouth daily.    [provider]  meloxicam (MOBIC) 15 MG tablet  05/29/18   [provider]  Multiple Vitamin (MULTIVITAMIN WITH MINERALS) TABS tablet Take 1 tablet by mouth daily.    [provider]  Omega-3 Fatty Acids (FISH OIL) 1000 MG CAPS Take 1 capsule by mouth daily.    [provider]  Propylene Glycol 0.6 % SOLN Place 1 drop into both eyes daily.    [provider]    Family History Family History  Problem Relation Age of Onset   Allergic rhinitis Mother    Allergic rhinitis Sister    Asthma Sister    Asthma Cousin    Eczema Daughter    Urticaria Neg Hx    Immunodeficiency Neg Hx     Social History Social History   Tobacco Use   Smoking status: Never   Smokeless tobacco: Never  Vaping Use   Vaping Use: Never used  Substance Use Topics   Alcohol use: No    Alcohol/week: 0.0 standard drinks   Drug use: No     Allergies   Ace inhibitors   Review of Systems Review of Systems  As stated above in HPI Physical Exam Triage Vital Signs ED Triage Vitals [09/11/20 1555]  Enc Vitals Group     BP (!) 142/81     Pulse Rate 88     Resp 18     Temp 98.6 F (37 C)     Temp Source Oral     SpO2 95 %     Weight      Height      Head Circumference      Peak Flow      Pain Score 6     Pain Loc      Pain Edu?      Excl. in GC?    No data found.  Updated Vital Signs BP (!) 142/81 (BP Location: Right Arm)   Pulse 88   Temp 98.6 F (37 C) (Oral)   Resp 18   SpO2 95%   Physical Exam Vitals and nursing note reviewed.  Constitutional:      Appearance: Normal appearance.  Cardiovascular:     Pulses: Normal pulses.  Musculoskeletal:        General: Swelling and tenderness present.     Right wrist: No snuff box tenderness.     Left wrist: No  snuff box tenderness.       Arms:  Skin:    Capillary Refill: Capillary refill takes less than 2 seconds.     Findings: Bruising present.  Neurological:     General: No focal deficit present.     Mental Status: She is alert.     Sensory: No sensory deficit.     Coordination: Coordination normal.     Deep Tendon Reflexes: Reflexes normal.     UC Treatments / Results  Labs (all labs ordered are listed, but only abnormal results are displayed) Labs Reviewed - No data to display  EKG   Radiology No results found.  Procedures Procedures (including critical care time)  Medications Ordered in UC Medications - No data to display  Initial Impression / Assessment and Plan / UC Course  I have reviewed the triage vital signs and the nursing notes.  Pertinent labs & imaging results that were available during my care of the patient were reviewed by me and considered in my medical decision making (see chart for details).     New.  X-ray is read as negative however on the lateral view I do see a small fracture of the radius.  This is exactly in the area of her discomfort so I am going to place her in a splint and have her follow-up with orthopedics in a week for repeat imaging.  I discussed with patient that there is a chance that this is not a fracture however there is less risk with splinting her then not in her risking a further injury.  She declined pain medication at this time as she is going to take over-the-counter medication as needed. Final Clinical Impressions(s) / UC Diagnoses   Final diagnoses:  None   Discharge Instructions   None    ED Prescriptions   None    PDMP not reviewed this encounter.   Rushie Chestnut, [place] 09/11/20 1637

## 2020-09-11 NOTE — Discharge Instructions (Addendum)
Make sure to eat plenty of calcium and vitamin D rich products to help with healing

## 2020-09-11 NOTE — Progress Notes (Signed)
Orthopedic Tech Progress Note Patient Details:  Tiffany Cunningham 1955/08/06 [id number]  Pt denies any numbness, tingling, or spots of irritation in relation to the splint. Showed uninterrupted ROM of Lt digits.  Ortho Devices Type of Ortho Device: Rad Gutter splint Ortho Device/Splint Location: LUE Ortho Device/Splint Interventions: Ordered, Application, Adjustment   Post Interventions Patient Tolerated: Well Instructions Provided: Care of device, Adjustment of device  Copeland Lapier Carmine Savoy 09/11/2020, 5:46 PM

## 2020-09-11 NOTE — ED Triage Notes (Signed)
Pt presents with left wrist injury after a fall X 2 days ago.

## 2021-06-11 NOTE — Progress Notes (Signed)
? ?Follow Up Note ? ?RE: Tiffany Cunningham MRN: [id number] DOB: 07/26/1955 ?Date of Office Visit: 06/12/2021 ? ?Referring provider: Ladora Daniel, PA-C ?Primary care provider: Ladora Daniel, PA-C ? ?Chief Complaint: Follow-up and Medication Refill ? ?History of Present Illness: ?I had the pleasure of seeing Tiffany Cunningham for a follow up visit at the [facility] on 06/12/2021. She is a 66 y.o. female, who is being followed for allergic rhinoconjunctivitis. Her previous allergy office visit was on 06/13/2020 with Dr. Selena Batten. Today is a regular follow up visit. ? ?Seasonal and perennial allergic rhinoconjunctivitis ?Uses dymista as needed with good benefit. No nosebleeds.  ?Taking zyrtec 10mg  daily x 1 month in April/May with good benefit.  ?Content with this regimen. ? ?Assessment and Plan: ?Tiffany Cunningham is a 66 y.o. female with: ?Seasonal and perennial allergic rhinoconjunctivitis ?Past history - 2017 skin testing was positive to trees, grass, weed, ragweed, mold and dust mites. ?Interim history - flares in the spring and fall and using Dymista and zyrtec prn during those times with good benefit.  ?Continue environmental control measures. ?May use over the counter antihistamines such as Zyrtec (cetirizine), Claritin (loratadine), Allegra (fexofenadine), or Xyzal (levocetirizine) daily as needed. ?May use dymista (fluticasone + azelastine nasal spray combination) 1 spray per nostril twice a day as needed. ?Nasal saline spray (i.e., Simply Saline) or nasal saline lavage (i.e., NeilMed) is recommended as needed and prior to medicated nasal sprays. ? ?Return in about 1 year (around 06/13/2022). ? ?Meds ordered this encounter  ?Medications  ? Azelastine-Fluticasone 137-50 MCG/ACT SUSP  ?  Sig: Place 1 spray into the nose 2 (two) times daily as needed.  ?  Dispense:  23 g  ?  Refill:  5  ? ?Lab Orders  ?No laboratory test(s) ordered today  ? ? ?Diagnostics: ?None.  ? ? ?Medication List:  ?Current Outpatient Medications   ?Medication Sig Dispense Refill  ? acetaminophen (TYLENOL) 325 MG tablet Take 325-650 mg by mouth every 6 (six) hours as needed (for pain).    ? amLODipine (NORVASC) 5 MG tablet Take 5 mg by mouth daily.    ? Azelastine-Fluticasone 137-50 MCG/ACT SUSP Place 1 spray into the nose 2 (two) times daily as needed. 23 g 5  ? baclofen (LIORESAL) 10 MG tablet Take by mouth.    ? diclofenac (VOLTAREN) 75 MG EC tablet Take 75 mg by mouth 2 (two) times daily.    ? DULoxetine (CYMBALTA) 60 MG capsule Take 1 capsule by mouth daily.    ? Guaifenesin 1200 MG TB12 Take 1 tablet by mouth 2 (two) times daily as needed.    ? losartan-hydrochlorothiazide (HYZAAR) 100-25 MG tablet Take 1 tablet by mouth daily.    ? magnesium oxide (MAG-OX) 400 MG tablet Take 400 mg by mouth daily.    ? Multiple Vitamin (MULTIVITAMIN WITH MINERALS) TABS tablet Take 1 tablet by mouth daily.    ? Omega-3 Fatty Acids (FISH OIL) 1000 MG CAPS Take 1 capsule by mouth daily.    ? Propylene Glycol 0.6 % SOLN Place 1 drop into both eyes daily.    ? ?No current facility-administered medications for this visit.  ? ?Allergies: ?Allergies  ?Allergen Reactions  ? Ace Inhibitors Cough  ? ?I reviewed her past medical history, social history, family history, and environmental history and no significant changes have been reported from her previous visit. ? ?Review of Systems  ?Constitutional:  Negative for appetite change, chills, fever and unexpected weight change.  ?HENT:  Negative for congestion and rhinorrhea.   ?Eyes:  Negative for itching.  ?Respiratory:  Negative for cough, chest tightness, shortness of breath and wheezing.   ?Gastrointestinal:  Negative for abdominal pain.  ?Skin:  Negative for rash.  ?Allergic/Immunologic: Positive for environmental allergies.  ?Neurological:  Negative for headaches.  ? ?Objective: ?BP 116/72   Pulse 76   Temp 97.8 ?F (36.6 ?C)   Resp 16   Ht 5\' 4"  (1.626 m)   Wt 164 lb 6 oz (74.6 kg)   SpO2 96%   BMI 28.21 kg/m?  ?Body  mass index is 28.21 kg/m? ?Physical Exam ?Vitals and nursing note reviewed.  ?Constitutional:   ?   Appearance: Normal appearance. She is well-developed.  ?HENT:  ?   Head: Normocephalic and atraumatic.  ?   Right Ear: External ear normal. There is impacted cerumen.  ?   Left Ear: Tympanic membrane and external ear normal.  ?   Nose: Nose normal.  ?   Mouth/Throat:  ?   Mouth: Mucous membranes are moist.  ?   Pharynx: Oropharynx is clear.  ?Eyes:  ?   Conjunctiva/sclera: Conjunctivae normal.  ?Cardiovascular:  ?   Rate and Rhythm: Normal rate and regular rhythm.  ?   Heart sounds: Normal heart sounds. No murmur heard. ?Pulmonary:  ?   Effort: Pulmonary effort is normal.  ?   Breath sounds: Normal breath sounds. No wheezing, rhonchi or rales.  ?Musculoskeletal:  ?   Cervical back: Neck supple.  ?Skin: ?   General: Skin is warm.  ?   Findings: No rash.  ?Neurological:  ?   Mental Status: She is alert and oriented to person, place, and time.  ?Psychiatric:     ?   Behavior: Behavior normal.  ?Previous notes and tests were reviewed. ?The plan was reviewed with the patient/family, and all questions/concerned were addressed. ? ?It was my pleasure to see Tiffany Cunningham today and participate in her care. Please feel free to contact me with any questions or concerns. ? ?Sincerely, ? ?Darl Pikes, DO ?Allergy & Immunology ? ?[facility] ?[place] office: [phone] ?[place] office: [phone] ?

## 2021-06-12 ENCOUNTER — Ambulatory Visit: Payer: No Typology Code available for payment source | Admitting: Allergy

## 2021-06-12 ENCOUNTER — Encounter: Payer: Self-pay | Admitting: Allergy

## 2021-06-12 VITALS — BP 116/72 | HR 76 | Temp 97.8°F | Resp 16 | Ht 64.0 in | Wt 164.4 lb

## 2021-06-12 DIAGNOSIS — J302 Other seasonal allergic rhinitis: Secondary | ICD-10-CM

## 2021-06-12 DIAGNOSIS — H1013 Acute atopic conjunctivitis, bilateral: Secondary | ICD-10-CM

## 2021-06-12 DIAGNOSIS — H1010 Acute atopic conjunctivitis, unspecified eye: Secondary | ICD-10-CM

## 2021-06-12 MED ORDER — AZELASTINE-FLUTICASONE 137-50 MCG/ACT NA SUSP: 1.0000 | Freq: Two times a day (BID) | NASAL | 5 refills | Status: AC | PRN

## 2021-06-12 NOTE — Assessment & Plan Note (Signed)
Past history - 2017 skin testing was positive to trees, grass, weed, ragweed, mold and dust mites. ?Interim history - flares in the spring and fall and using Dymista and zyrtec prn during those times with good benefit.  ?? Continue environmental control measures. ?? May use over the counter antihistamines such as Zyrtec (cetirizine), Claritin (loratadine), Allegra (fexofenadine), or Xyzal (levocetirizine) daily as needed. ?? May use dymista (fluticasone + azelastine nasal spray combination) 1 spray per nostril twice a day as needed. ?? Nasal saline spray (i.e., Simply Saline) or nasal saline lavage (i.e., NeilMed) is recommended as needed and prior to medicated nasal sprays. ?

## 2021-06-12 NOTE — Patient Instructions (Signed)
Allergic rhino conjunctivitis   2017 skin testing was positive to trees, grass, weed, ragweed, mold and dust mites.  Continue environmental control measures  May use over the counter antihistamines such as Zyrtec (cetirizine), Claritin (loratadine), Allegra (fexofenadine), or Xyzal (levocetirizine) daily as needed. May use dymista (fluticasone + azelastine nasal spray combination) 1 spray per nostril twice a day as needed.  Nasal saline spray (i.e., Simply Saline) or nasal saline lavage (i.e., NeilMed) is recommended as needed and prior to medicated nasal sprays.  Follow up in 12  months or sooner if needed.   Reducing Pollen Exposure . Pollen seasons: trees (spring), grass (summer) and ragweed/weeds (fall). . Keep windows closed in your home and car to lower pollen exposure.  . Install air conditioning in the bedroom and throughout the house if possible.  . Avoid going out in dry windy days - especially early morning. . Pollen counts are highest between 5 - 10 AM and on dry, hot and windy days.  . Save outside activities for late afternoon or after a heavy rain, when pollen levels are lower.  . Avoid mowing of grass if you have grass pollen allergy. . Be aware that pollen can also be transported indoors on people and pets.  . Dry your clothes in an automatic dryer rather than hanging them outside where they might collect pollen.  . Rinse hair and eyes before bedtime. Control of House Dust Mite Allergen . Dust mite allergens are a common trigger of allergy and asthma symptoms. While they can be found throughout the house, these microscopic creatures thrive in warm, humid environments such as bedding, upholstered furniture and carpeting. . Because so much time is spent in the bedroom, it is essential to reduce mite levels there.  . Encase pillows, mattresses, and box springs in special allergen-proof fabric covers or airtight, zippered plastic covers.  . Bedding should be washed weekly in  hot water (130 F) and dried in a hot dryer. Allergen-proof covers are available for comforters and pillows that can't be regularly washed.  . Wash the allergy-proof covers every few months. Minimize clutter in the bedroom. Keep pets out of the bedroom.  . Keep humidity less than 50% by using a dehumidifier or air conditioning. You can buy a humidity measuring device called a hygrometer to monitor this.  . If possible, replace carpets with hardwood, linoleum, or washable area rugs. If that's not possible, vacuum frequently with a vacuum that has a HEPA filter. . Remove all upholstered furniture and non-washable window drapes from the bedroom. . Remove all non-washable stuffed toys from the bedroom.  Wash stuffed toys weekly. Mold Control . Mold and fungi can grow on a variety of surfaces provided certain temperature and moisture conditions exist.  . Outdoor molds grow on plants, decaying vegetation and soil. The major outdoor mold, Alternaria and Cladosporium, are found in very high numbers during hot and dry conditions. Generally, a late summer - fall peak is seen for common outdoor fungal spores. Rain will temporarily lower outdoor mold spore count, but counts rise rapidly when the rainy period ends. . The most important indoor molds are Aspergillus and Penicillium. Dark, humid and poorly ventilated basements are ideal sites for mold growth. The next most common sites of mold growth are the bathroom and the kitchen. Outdoor (Seasonal) Mold Control . Use air conditioning and keep windows closed. . Avoid exposure to decaying vegetation. . Avoid leaf raking. . Avoid grain handling. . Consider wearing a face mask if working   in moldy areas.  Indoor (Perennial) Mold Control  . Maintain humidity below 50%. . Get rid of mold growth on hard surfaces with water, detergent and, if necessary, 5% bleach (do not mix with other cleaners). Then dry the area completely. If mold covers an area more than 10 square  feet, consider hiring an indoor environmental professional. . For clothing, washing with soap and water is best. If moldy items cannot be cleaned and dried, throw them away. . Remove sources e.g. contaminated carpets. . Repair and seal leaking roofs or pipes. Using dehumidifiers in damp basements may be helpful, but empty the water and clean units regularly to prevent mildew from forming. All rooms, especially basements, bathrooms and kitchens, require ventilation and cleaning to deter mold and mildew growth. Avoid carpeting on concrete or damp floors, and storing items in damp areas.  

## 2021-06-29 ENCOUNTER — Other Ambulatory Visit: Payer: Self-pay

## 2021-06-29 ENCOUNTER — Encounter (HOSPITAL_BASED_OUTPATIENT_CLINIC_OR_DEPARTMENT_OTHER): Payer: Self-pay

## 2021-06-29 ENCOUNTER — Inpatient Hospital Stay (HOSPITAL_BASED_OUTPATIENT_CLINIC_OR_DEPARTMENT_OTHER)
Admission: EM | Admit: 2021-06-29 | Discharge: 2021-07-02 | DRG: 418 | Disposition: A | Discharge status: Home / Self Care | Payer: No Typology Code available for payment source | Attending: Family Medicine | Admitting: Family Medicine

## 2021-06-29 ENCOUNTER — Emergency Department (HOSPITAL_BASED_OUTPATIENT_CLINIC_OR_DEPARTMENT_OTHER): Payer: No Typology Code available for payment source

## 2021-06-29 DIAGNOSIS — K8010 Calculus of gallbladder with chronic cholecystitis without obstruction: Secondary | ICD-10-CM | POA: Diagnosis present

## 2021-06-29 DIAGNOSIS — R011 Cardiac murmur, unspecified: Secondary | ICD-10-CM | POA: Diagnosis present

## 2021-06-29 DIAGNOSIS — R1013 Epigastric pain: Secondary | ICD-10-CM | POA: Diagnosis not present

## 2021-06-29 DIAGNOSIS — K851 Biliary acute pancreatitis without necrosis or infection: Secondary | ICD-10-CM | POA: Diagnosis not present

## 2021-06-29 DIAGNOSIS — R8281 Pyuria: Secondary | ICD-10-CM | POA: Diagnosis present

## 2021-06-29 DIAGNOSIS — I10 Essential (primary) hypertension: Secondary | ICD-10-CM | POA: Diagnosis present

## 2021-06-29 DIAGNOSIS — Z888 Allergy status to other drugs, medicaments and biological substances status: Secondary | ICD-10-CM

## 2021-06-29 DIAGNOSIS — I341 Nonrheumatic mitral (valve) prolapse: Secondary | ICD-10-CM | POA: Diagnosis present

## 2021-06-29 DIAGNOSIS — I700 Atherosclerosis of aorta: Secondary | ICD-10-CM | POA: Diagnosis present

## 2021-06-29 DIAGNOSIS — Z9049 Acquired absence of other specified parts of digestive tract: Secondary | ICD-10-CM

## 2021-06-29 DIAGNOSIS — K859 Acute pancreatitis without necrosis or infection, unspecified: Principal | ICD-10-CM

## 2021-06-29 DIAGNOSIS — Z79899 Other long term (current) drug therapy: Secondary | ICD-10-CM

## 2021-06-29 HISTORY — DX: Nonrheumatic mitral (valve) prolapse: I34.1

## 2021-06-29 LAB — URINALYSIS, ROUTINE W REFLEX MICROSCOPIC
Bilirubin Urine: NEGATIVE
Glucose, UA: NEGATIVE mg/dL
Hgb urine dipstick: NEGATIVE
Ketones, ur: 15 mg/dL — AB
Nitrite: NEGATIVE
Specific Gravity, Urine: 1.016 (ref 1.005–1.030)
WBC, UA: >50 WBC/hpf — ABNORMAL HIGH (ref 0–5)
pH: 8 (ref 5.0–8.0)

## 2021-06-29 LAB — CBC
HCT: 39.7 % (ref 36.0–46.0)
Hemoglobin: 13.1 g/dL (ref 12.0–15.0)
MCH: 28.7 pg (ref 26.0–34.0)
MCHC: 33 g/dL (ref 30.0–36.0)
MCV: 86.9 fL (ref 80.0–100.0)
Platelets: 327 10*3/uL (ref 150–400)
RBC: 4.57 MIL/uL (ref 3.87–5.11)
RDW: 12.3 % (ref 11.5–15.5)
WBC: 13 10*3/uL — ABNORMAL HIGH (ref 4.0–10.5)
nRBC: 0 % (ref 0.0–0.2)

## 2021-06-29 LAB — COMPREHENSIVE METABOLIC PANEL
ALT: 630 U/L — ABNORMAL HIGH (ref 0–44)
AST: 362 U/L — ABNORMAL HIGH (ref 15–41)
Albumin: 4.2 g/dL (ref 3.5–5.0)
Alkaline Phosphatase: 127 U/L — ABNORMAL HIGH (ref 38–126)
Anion gap: 11 (ref 5–15)
BUN: 18 mg/dL (ref 8–23)
CO2: 30 mmol/L (ref 22–32)
Calcium: 9.4 mg/dL (ref 8.9–10.3)
Chloride: 99 mmol/L (ref 98–111)
Creatinine, Ser: 0.68 mg/dL (ref 0.44–1.00)
GFR, Estimated: >60 mL/min (ref >60)
Glucose, Bld: 118 mg/dL — ABNORMAL HIGH (ref 70–99)
Potassium: 3.2 mmol/L — ABNORMAL LOW (ref 3.5–5.1)
Sodium: 140 mmol/L (ref 135–145)
Total Bilirubin: 0.9 mg/dL (ref 0.3–1.2)
Total Protein: 7.3 g/dL (ref 6.5–8.1)

## 2021-06-29 LAB — LIPASE, BLOOD: Lipase: 374 U/L — ABNORMAL HIGH (ref 11–51)

## 2021-06-29 MED ORDER — ONDANSETRON HCL 4 MG/2ML IJ SOLN
4.0000 mg | Freq: Once | INTRAMUSCULAR | Status: AC
  Administered 2021-06-29: 4 mg via INTRAVENOUS
  Filled 2021-06-29: qty 2

## 2021-06-29 MED ORDER — FENTANYL CITRATE PF 50 MCG/ML IJ SOSY
50.0000 ug | PREFILLED_SYRINGE | Freq: Once | INTRAMUSCULAR | Status: AC
  Administered 2021-06-29: 50 ug via INTRAVENOUS
  Filled 2021-06-29: qty 1

## 2021-06-29 MED ORDER — IOHEXOL 300 MG/ML  SOLN
100.0000 mL | Freq: Once | INTRAMUSCULAR | Status: AC | PRN
  Administered 2021-06-29: 80 mL via INTRAVENOUS

## 2021-06-29 MED ORDER — LACTATED RINGERS IV BOLUS
1000.0000 mL | Freq: Once | INTRAVENOUS | Status: AC
  Administered 2021-06-29: 1000 mL via INTRAVENOUS

## 2021-06-29 NOTE — ED Notes (Signed)
Patient transported to CT 

## 2021-06-29 NOTE — ED Provider Notes (Signed)
Care of the patient assumed at the change of shift. Here for 24 hours of epigastric pain, vomiting, worse after eating. Pending CT. Labs concerning for pancreatitis/biliary obstruction.   Physical Exam  BP 137/65 (BP Location: Left Arm)   Pulse 92   Temp 98.8 F (37.1 C) (Temporal)   Resp 16   Ht 5\' 4"  (1.626 m)   Wt 74.6 kg   SpO2 97%   BMI 28.23 kg/m   Physical Exam  Procedures  Procedures  ED Course / MDM   Clinical Course as of 06/30/21 0133  Thu Jun 29, 2021  2357 I personally viewed the images from radiology studies and agree with radiologist interpretation: CT shows pancreatitis, but no signs of biliary obstruction or infection. Will plan admission for further investigation, pain control, bowel rest and hydration. Hospitalist paged.   [CS]  Fri Jun 30, 2021  0132 Spoke with Dr. [id number], Hospitalist, who will accept for admission.  [CS]    Clinical Course User Index [CS] Toniann Fail, MD   Medical Decision Making Problems Addressed: Acute pancreatitis without infection or necrosis, unspecified pancreatitis type: acute illness or injury  Amount and/or Complexity of Data Reviewed Labs: ordered. Decision-making details documented in ED Course. Radiology: ordered and independent interpretation performed. Decision-making details documented in ED Course. ECG/medicine tests: ordered and independent interpretation performed. Decision-making details documented in ED Course.  Risk Prescription drug management. Decision regarding hospitalization.          Pollyann Savoy, MD 06/30/21 [phone]

## 2021-06-29 NOTE — ED Triage Notes (Signed)
Patient here POV from Home.  Endorses ABD Pain that is Epigastric. Present for 2-3 Days and worsened recently. Aching and Constant in [place].  Moderate Nausea. Emesis last PM. No Urinary or BM Changes. No Known Fevers.  NAD Noted during Triage. A&Ox4. GCS 15. Ambulatory.

## 2021-06-29 NOTE — ED Provider Notes (Signed)
[facility] EMERGENCY DEPT Provider Note   CSN: [id number] Arrival date & time: 06/29/21  2213     History  Chief Complaint  Patient presents with   Abdominal Pain    Tiffany Cunningham is a 66 y.o. female.   Abdominal Pain Patient presents with upper abdominal pain.  Began yesterday or day before.  Upper abdomen.  Worse with eating.  Had some nausea and vomited last night.  No diarrhea constipation.  Has not really had pains like this before.  Still has her gallbladder.  Goes to the left side.  Previous appendectomy.   Past Medical History:  Diagnosis Date   Urticaria    Past Surgical History:  Procedure Laterality Date   APPENDECTOMY  02/05/1977   HIP SURGERY Left    OSTEOTOMY  02/06/1991   SHOULDER SURGERY Left     Home Medications Prior to Admission medications   Medication Sig Start Date End Date Taking? Authorizing Provider  acetaminophen (TYLENOL) 325 MG tablet Take 325-650 mg by mouth every 6 (six) hours as needed (for pain).    [provider]  amLODipine (NORVASC) 5 MG tablet Take 5 mg by mouth daily. 04/30/21   [provider]  Azelastine-Fluticasone 137-50 MCG/ACT SUSP Place 1 spray into the nose 2 (two) times daily as needed. 06/12/21   Ellamae Sia, DO  baclofen (LIORESAL) 10 MG tablet Take by mouth. 04/17/19   [provider]  diclofenac (VOLTAREN) 75 MG EC tablet Take 75 mg by mouth 2 (two) times daily. 06/02/21   [provider]  DULoxetine (CYMBALTA) 60 MG capsule Take 1 capsule by mouth daily. 10/23/19   [provider]  Guaifenesin 1200 MG TB12 Take 1 tablet by mouth 2 (two) times daily as needed.    [provider]  losartan-hydrochlorothiazide (HYZAAR) 100-25 MG tablet Take 1 tablet by mouth daily.    [provider]  magnesium oxide (MAG-OX) 400 MG tablet Take 400 mg by mouth daily.    [provider]  Multiple Vitamin (MULTIVITAMIN WITH MINERALS) TABS tablet Take 1 tablet by  mouth daily.    [provider]  Omega-3 Fatty Acids (FISH OIL) 1000 MG CAPS Take 1 capsule by mouth daily.    [provider]  Propylene Glycol 0.6 % SOLN Place 1 drop into both eyes daily.    [provider]      Allergies    Ace inhibitors    Review of Systems   Review of Systems  Gastrointestinal:  Positive for abdominal pain.   Physical Exam Updated Vital Signs BP 137/65 (BP Location: Left Arm)   Pulse 92   Temp 98.8 F (37.1 C) (Temporal)   Resp 16   Ht 5\' 4"  (1.626 m)   Wt 74.6 kg   SpO2 97%   BMI 28.23 kg/m  Physical Exam Vitals and nursing note reviewed.  Cardiovascular:     Rate and Rhythm: Normal rate.  Abdominal:     Tenderness: There is abdominal tenderness.     Comments: Moderate tenderness in epigastric to left upper quadrant.  No hernia palpated.  No mass.  Skin:    General: Skin is warm.     Capillary Refill: Capillary refill takes less than 2 seconds.  Neurological:     Mental Status: She is alert and oriented to person, place, and time.    ED Results / Procedures / Treatments   Labs (all labs ordered are listed, but only abnormal results are displayed) Labs  Reviewed  CBC - Abnormal; Notable for the following components:      Result Value   WBC 13.0 (*)    All other components within normal limits  URINALYSIS, ROUTINE W REFLEX MICROSCOPIC - Abnormal; Notable for the following components:   APPearance HAZY (*)    Ketones, ur 15 (*)    Protein, ur TRACE (*)    Leukocytes,Ua LARGE (*)    WBC, UA >50 (*)    Bacteria, UA FEW (*)    Non Squamous Epithelial 0-5 (*)    All other components within normal limits  LIPASE, BLOOD  COMPREHENSIVE METABOLIC PANEL    EKG None  Radiology No results found.  Procedures Procedures    Medications Ordered in ED Medications - No data to display  ED Course/ Medical Decision Making/ A&P                           Medical Decision Making Amount and/or Complexity of Data  Reviewed Labs: ordered. Radiology: ordered.   Patient with epigastric to left upper quadrant abdominal pain.  White count elevated at 13.  Tender.  Urine does have some white cells and few bacteria.  However with tenderness epigastric area to the left upper quadrant with the amount of tenderness will get CT scan.  Worried biliary issue versus potentially pancreatitis or bowel issue.  Some nausea and vomiting.  Will get CT scan.  Care turned over to Dr. Bernette Mayers take it        Final Clinical Impression(s) / ED Diagnoses Final diagnoses:  None    Rx / DC Orders ED Discharge Orders     None         Benjiman Core, MD 06/29/21 2255

## 2021-06-30 ENCOUNTER — Inpatient Hospital Stay (HOSPITAL_COMMUNITY): Payer: No Typology Code available for payment source

## 2021-06-30 ENCOUNTER — Encounter (HOSPITAL_COMMUNITY): Payer: Self-pay | Admitting: Internal Medicine

## 2021-06-30 DIAGNOSIS — K851 Biliary acute pancreatitis without necrosis or infection: Secondary | ICD-10-CM | POA: Diagnosis present

## 2021-06-30 DIAGNOSIS — I341 Nonrheumatic mitral (valve) prolapse: Secondary | ICD-10-CM | POA: Diagnosis present

## 2021-06-30 DIAGNOSIS — Z79899 Other long term (current) drug therapy: Secondary | ICD-10-CM | POA: Diagnosis not present

## 2021-06-30 DIAGNOSIS — Z888 Allergy status to other drugs, medicaments and biological substances status: Secondary | ICD-10-CM | POA: Diagnosis not present

## 2021-06-30 DIAGNOSIS — K8010 Calculus of gallbladder with chronic cholecystitis without obstruction: Secondary | ICD-10-CM | POA: Diagnosis present

## 2021-06-30 DIAGNOSIS — R8281 Pyuria: Secondary | ICD-10-CM | POA: Insufficient documentation

## 2021-06-30 DIAGNOSIS — Z9049 Acquired absence of other specified parts of digestive tract: Secondary | ICD-10-CM | POA: Diagnosis not present

## 2021-06-30 DIAGNOSIS — I700 Atherosclerosis of aorta: Secondary | ICD-10-CM | POA: Diagnosis present

## 2021-06-30 DIAGNOSIS — R011 Cardiac murmur, unspecified: Secondary | ICD-10-CM | POA: Diagnosis present

## 2021-06-30 DIAGNOSIS — I10 Essential (primary) hypertension: Secondary | ICD-10-CM

## 2021-06-30 DIAGNOSIS — K859 Acute pancreatitis without necrosis or infection, unspecified: Secondary | ICD-10-CM

## 2021-06-30 DIAGNOSIS — R1013 Epigastric pain: Secondary | ICD-10-CM | POA: Diagnosis present

## 2021-06-30 HISTORY — DX: Atherosclerosis of aorta: I70.0

## 2021-06-30 LAB — COMPREHENSIVE METABOLIC PANEL
ALT: 536 U/L — ABNORMAL HIGH (ref 0–44)
AST: 262 U/L — ABNORMAL HIGH (ref 15–41)
Albumin: 3.7 g/dL (ref 3.5–5.0)
Alkaline Phosphatase: 103 U/L (ref 38–126)
Anion gap: 8 (ref 5–15)
BUN: 16 mg/dL (ref 8–23)
CO2: 30 mmol/L (ref 22–32)
Calcium: 8.7 mg/dL — ABNORMAL LOW (ref 8.9–10.3)
Chloride: 104 mmol/L (ref 98–111)
Creatinine, Ser: 0.59 mg/dL (ref 0.44–1.00)
GFR, Estimated: >60 mL/min (ref >60)
Glucose, Bld: 116 mg/dL — ABNORMAL HIGH (ref 70–99)
Potassium: 3.4 mmol/L — ABNORMAL LOW (ref 3.5–5.1)
Sodium: 142 mmol/L (ref 135–145)
Total Bilirubin: 0.8 mg/dL (ref 0.3–1.2)
Total Protein: 6.7 g/dL (ref 6.5–8.1)

## 2021-06-30 LAB — HIV ANTIBODY (ROUTINE TESTING W REFLEX): HIV Screen 4th Generation wRfx: NONREACTIVE

## 2021-06-30 LAB — CBC
HCT: 36.3 % (ref 36.0–46.0)
Hemoglobin: 11.8 g/dL — ABNORMAL LOW (ref 12.0–15.0)
MCH: 29.4 pg (ref 26.0–34.0)
MCHC: 32.5 g/dL (ref 30.0–36.0)
MCV: 90.3 fL (ref 80.0–100.0)
Platelets: 255 10*3/uL (ref 150–400)
RBC: 4.02 MIL/uL (ref 3.87–5.11)
RDW: 12.4 % (ref 11.5–15.5)
WBC: 7.9 10*3/uL (ref 4.0–10.5)
nRBC: 0 % (ref 0.0–0.2)

## 2021-06-30 LAB — LIPID PANEL
Cholesterol: 238 mg/dL — ABNORMAL HIGH (ref 0–200)
HDL: 74 mg/dL (ref >40)
LDL Cholesterol: 145 mg/dL — ABNORMAL HIGH (ref 0–99)
Total CHOL/HDL Ratio: 3.2 RATIO
Triglycerides: 95 mg/dL (ref <150)
VLDL: 19 mg/dL (ref 0–40)

## 2021-06-30 LAB — TRIGLYCERIDES: Triglycerides: 63 mg/dL (ref <150)

## 2021-06-30 MED ORDER — BUPIVACAINE LIPOSOME 1.3 % IJ SUSP: 20.0000 mL | Freq: Once | INTRAMUSCULAR | Status: DC

## 2021-06-30 MED ORDER — AMLODIPINE BESYLATE 5 MG PO TABS
5.0000 mg | ORAL_TABLET | Freq: Every day | ORAL | Status: DC
  Administered 2021-06-30 – 2021-07-02 (×3): 5 mg via ORAL
  Filled 2021-06-30 (×3): qty 1

## 2021-06-30 MED ORDER — ONDANSETRON HCL 4 MG/2ML IJ SOLN
4.0000 mg | Freq: Four times a day (QID) | INTRAMUSCULAR | Status: DC | PRN
  Administered 2021-06-30: 4 mg via INTRAVENOUS
  Filled 2021-06-30: qty 2

## 2021-06-30 MED ORDER — ENOXAPARIN SODIUM 40 MG/0.4ML IJ SOSY
40.0000 mg | PREFILLED_SYRINGE | INTRAMUSCULAR | Status: DC
  Administered 2021-06-30: 40 mg via SUBCUTANEOUS
  Filled 2021-06-30 (×2): qty 0.4

## 2021-06-30 MED ORDER — DULOXETINE HCL 60 MG PO CPEP
60.0000 mg | ORAL_CAPSULE | Freq: Every day | ORAL | Status: DC
  Administered 2021-06-30 – 2021-07-02 (×3): 60 mg via ORAL
  Filled 2021-06-30 (×3): qty 1

## 2021-06-30 MED ORDER — MAGNESIUM OXIDE -MG SUPPLEMENT 400 (240 MG) MG PO TABS
400.0000 mg | ORAL_TABLET | Freq: Every day | ORAL | Status: DC
  Administered 2021-06-30 – 2021-07-02 (×3): 400 mg via ORAL
  Filled 2021-06-30 (×3): qty 1

## 2021-06-30 MED ORDER — METRONIDAZOLE 500 MG/100ML IV SOLN
500.0000 mg | Freq: Two times a day (BID) | INTRAVENOUS | Status: DC
  Administered 2021-06-30: 500 mg via INTRAVENOUS
  Filled 2021-06-30: qty 100

## 2021-06-30 MED ORDER — HYDROMORPHONE HCL 1 MG/ML IJ SOLN
1.0000 mg | Freq: Once | INTRAMUSCULAR | Status: AC
  Administered 2021-06-30: 1 mg via INTRAVENOUS
  Filled 2021-06-30: qty 1

## 2021-06-30 MED ORDER — ACETAMINOPHEN 325 MG PO TABS
650.0000 mg | ORAL_TABLET | Freq: Four times a day (QID) | ORAL | Status: DC | PRN
  Administered 2021-06-30 – 2021-07-01 (×2): 650 mg via ORAL
  Filled 2021-06-30: qty 2

## 2021-06-30 MED ORDER — LACTATED RINGERS IV SOLN: INTRAVENOUS | Status: AC

## 2021-06-30 MED ORDER — ACETAMINOPHEN 500 MG PO TABS
1000.0000 mg | ORAL_TABLET | ORAL | Status: DC
  Filled 2021-06-30: qty 2

## 2021-06-30 MED ORDER — CHLORHEXIDINE GLUCONATE CLOTH 2 % EX PADS
6.0000 | MEDICATED_PAD | Freq: Once | CUTANEOUS | Status: AC
  Administered 2021-06-30: 6 via TOPICAL

## 2021-06-30 MED ORDER — GABAPENTIN 300 MG PO CAPS
300.0000 mg | ORAL_CAPSULE | ORAL | Status: AC
  Administered 2021-07-01: 300 mg via ORAL
  Filled 2021-06-30: qty 1

## 2021-06-30 MED ORDER — ONDANSETRON HCL 4 MG PO TABS: 4.0000 mg | ORAL_TABLET | Freq: Four times a day (QID) | ORAL | Status: DC | PRN

## 2021-06-30 MED ORDER — CHLORHEXIDINE GLUCONATE CLOTH 2 % EX PADS
6.0000 | MEDICATED_PAD | Freq: Once | CUTANEOUS | Status: AC
  Administered 2021-07-01: 6 via TOPICAL

## 2021-06-30 MED ORDER — SODIUM CHLORIDE 0.9 % IV SOLN
2.0000 g | INTRAVENOUS | Status: DC
  Administered 2021-06-30: 2 g via INTRAVENOUS
  Filled 2021-06-30: qty 20

## 2021-06-30 MED ORDER — CELECOXIB 200 MG PO CAPS
200.0000 mg | ORAL_CAPSULE | ORAL | Status: AC
  Administered 2021-07-01: 200 mg via ORAL
  Filled 2021-06-30: qty 1

## 2021-06-30 MED ORDER — ADULT MULTIVITAMIN W/MINERALS CH
1.0000 | ORAL_TABLET | Freq: Every day | ORAL | Status: DC
  Administered 2021-06-30 – 2021-07-02 (×3): 1 via ORAL
  Filled 2021-06-30 (×3): qty 1

## 2021-06-30 MED ORDER — ACETAMINOPHEN 650 MG RE SUPP: 650.0000 mg | Freq: Four times a day (QID) | RECTAL | Status: DC | PRN

## 2021-06-30 MED ORDER — ENSURE PRE-SURGERY PO LIQD
296.0000 mL | Freq: Once | ORAL | Status: AC
  Administered 2021-07-01: 296 mL via ORAL
  Filled 2021-06-30: qty 296

## 2021-06-30 MED ORDER — HYDROMORPHONE HCL 1 MG/ML IJ SOLN
0.5000 mg | INTRAMUSCULAR | Status: DC | PRN
  Administered 2021-06-30: 1 mg via INTRAVENOUS
  Filled 2021-06-30: qty 1

## 2021-06-30 NOTE — ED Notes (Signed)
Pt O2 dropped to 88% after pain meds, 2L [place] applied. O2 improved to 95%, NAD.

## 2021-06-30 NOTE — H&P (Signed)
History and Physical    Patient: Tiffany PiqueSusan M Quin [id number] DOB: May 06, 1955 DOA: 06/29/2021 DOS: the patient was seen and examined on 06/30/2021 PCP: Ladora DanielBeal, Sheri, PA-C  Patient coming from: Home  Chief Complaint:  Chief Complaint  Patient presents with   Abdominal Pain   HPI: Tiffany PiqueSusan M Cunningham is a 66 y.o. female with medical history significant of HTN.  Question of MVP in 2017 though didn't see this on more recent echo according to cards 2021 office note, prior obesity now just over weight.  Pt presents to ED with c/o 24h h/o epigastric abd pain, N/V, worse after eating.  Symptoms constant, severe.  Doesn't drink alcohol (at all).  Does have a family history of pancreatitis in sister, who had autoimmune pancreatitis.   Review of Systems: As mentioned in the history of present illness. All other systems reviewed and are negative. Past Medical History:  Diagnosis Date   Mitral valve prolapse    Question of MVP in 2017 though looks like they didnt see this on repeat echo according to cards 2021 office note.   Urticaria    Past Surgical History:  Procedure Laterality Date   APPENDECTOMY  02/05/1977   HIP SURGERY Left    OSTEOTOMY  02/06/1991   SHOULDER SURGERY Left    Social History:  reports that she has never smoked. She has never used smokeless tobacco. She reports that she does not drink alcohol and does not use drugs.  Allergies  Allergen Reactions   Ace Inhibitors Cough    Family History  Problem Relation Age of Onset   Allergic rhinitis Mother    Allergic rhinitis Sister    Asthma Sister    Asthma Cousin    Eczema Daughter    Urticaria Neg Hx    Immunodeficiency Neg Hx     Prior to Admission medications   Medication Sig Start Date End Date Taking? Authorizing Provider  amLODipine (NORVASC) 5 MG tablet Take 5 mg by mouth daily. 04/30/21  Yes [provider]  diclofenac (VOLTAREN) 75 MG EC tablet Take 75 mg by mouth 2 (two) times daily. 06/02/21  Yes  [provider]  DULoxetine (CYMBALTA) 60 MG capsule Take 1 capsule by mouth daily. 10/23/19  Yes [provider]  Multiple Vitamin (MULTIVITAMIN WITH MINERALS) TABS tablet Take 1 tablet by mouth daily.   Yes [provider]  Omega-3 Fatty Acids (FISH OIL) 1000 MG CAPS Take 1 capsule by mouth daily.   Yes [provider]  Propylene Glycol 0.6 % SOLN Place 1 drop into both eyes daily.   Yes [provider]  acetaminophen (TYLENOL) 325 MG tablet Take 325-650 mg by mouth every 6 (six) hours as needed (for pain).    [provider]  Azelastine-Fluticasone 137-50 MCG/ACT SUSP Place 1 spray into the nose 2 (two) times daily as needed. 06/12/21   Ellamae SiaKim, Yoon M, DO  baclofen (LIORESAL) 10 MG tablet Take by mouth. 04/17/19   [provider]  Guaifenesin 1200 MG TB12 Take 1 tablet by mouth 2 (two) times daily as needed.    [provider]  losartan-hydrochlorothiazide (HYZAAR) 100-25 MG tablet Take 1 tablet by mouth daily.    [provider]  magnesium oxide (MAG-OX) 400 MG tablet Take 400 mg by mouth daily.    [provider]    Physical Exam: Vitals:   06/30/21 0047 06/30/21 0100 06/30/21 0130 06/30/21 0300  BP:  136/68 114/70 140/72  Pulse:  93 91 83  Resp:  17 13 20   Temp:    98.2 F (36.8 C)  TempSrc:    Oral  SpO2: 95% 96% 97% 92%  Weight:    74.3 kg  Height:    5\' 4"  (1.626 m)   Constitutional: NAD, calm, comfortable Eyes: PERRL, lids and conjunctivae normal ENMT: Mucous membranes are moist. Posterior pharynx clear of any exudate or lesions.Normal dentition.  Neck: normal, supple, no masses, no thyromegaly Respiratory: clear to auscultation bilaterally, no wheezing, no crackles. Normal respiratory effort. No accessory muscle use.  Cardiovascular: Regular rate and rhythm, no murmurs / rubs / gallops. No extremity edema. 2+ pedal pulses. No carotid bruits.  Abdomen: Epigastric TTP Musculoskeletal: no  clubbing / cyanosis. No joint deformity upper and lower extremities. Good ROM, no contractures. Normal muscle tone.  Skin: no rashes, lesions, ulcers. No induration Neurologic: CN 2-12 grossly intact. Sensation intact, DTR normal. Strength 5/5 in all 4.  Psychiatric: Normal judgment and insight. Alert and oriented x 3. Normal mood.   Data Reviewed:    CMP     Component Value Date/Time   NA 140 06/29/2021 2231   K 3.2 (L) 06/29/2021 2231   CL 99 06/29/2021 2231   CO2 30 06/29/2021 2231   GLUCOSE 118 (H) 06/29/2021 2231   BUN 18 06/29/2021 2231   CREATININE 0.68 06/29/2021 2231   CALCIUM 9.4 06/29/2021 2231   PROT 7.3 06/29/2021 2231   ALBUMIN 4.2 06/29/2021 2231   AST 362 (H) 06/29/2021 2231   ALT 630 (H) 06/29/2021 2231   ALKPHOS 127 (H) 06/29/2021 2231   BILITOT 0.9 06/29/2021 2231   GFRNONAA >60 06/29/2021 2231   GFRAA >60 05/29/2016 0959   Lipase 374.  CBC    Component Value Date/Time   WBC 13.0 (H) 06/29/2021 2231   RBC 4.57 06/29/2021 2231   HGB 13.1 06/29/2021 2231   HCT 39.7 06/29/2021 2231   PLT 327 06/29/2021 2231   MCV 86.9 06/29/2021 2231   MCH 28.7 06/29/2021 2231   MCHC 33.0 06/29/2021 2231   RDW 12.3 06/29/2021 2231   LYMPHSABS 1.6 11/11/2015 0408   MONOABS 0.9 11/11/2015 0408   EOSABS 0.1 11/11/2015 0408   BASOSABS 0.0 11/11/2015 0408   Urinalysis    Component Value Date/Time   COLORURINE YELLOW 06/29/2021 2231   APPEARANCEUR HAZY (A) 06/29/2021 2231   LABSPEC 1.016 06/29/2021 2231   PHURINE 8.0 06/29/2021 2231   GLUCOSEU NEGATIVE 06/29/2021 2231   HGBUR NEGATIVE 06/29/2021 2231   BILIRUBINUR NEGATIVE 06/29/2021 2231   KETONESUR 15 (A) 06/29/2021 2231   PROTEINUR TRACE (A) 06/29/2021 2231   NITRITE NEGATIVE 06/29/2021 2231   LEUKOCYTESUR LARGE (A) 06/29/2021 2231    CT AP = Mild acute interstitial/edematous pancreatitis. No pancreatic or peripancreatic necrosis. No loculated peripancreatic fluid collections. Associated shotty probable  reactive peripancreatic and periportal adenopathy.   Moderate hepatic steatosis.   Moderate distal colonic diverticulosis without superimposed acute inflammatory change.  07/01/2021 RUQ = Cholelithiasis without sonographic evidence of acute cholecystitis.   Dilation of the extrahepatic bile duct. A distal obstructing lesion is not excluded. ERCP or MRCP examination may be more helpful for further evaluation.   Mild hepatic steatosis.   Assessment and Plan: * Acute pancreatitis ? Gallstone pancreatitis? Normal looking gallbladder and CBD on CT scan though. However she does have LFT elevations No EtOH use. Has family history of autoimmune pancreatitis (in sister). Repeat CMP in AM RUQ 07/01/2021 Cholelithiasis without evidence of cholecystitis. But has extrahepatic CBD dilation and they recommend ERCP  vs MRCP Ill order MRCP. NPO IVF WBC only 13k, no other SIRS, will hold off on ABx for the moment pending [place] read. zofran and dilaudid PRN Given US findings, will also empirically put her on rocephin + flagyl for the moment. May need GI consult depending on MRCP findings. Ill send them a heads up via secure chat.  [place] Question of UTI based on UA Getting UCx Getting rocephin / flagyl empirically for the moment anyhow in setting of question of choledocholithiasis.  Essential hypertension Continue home norvasc Hold home HCTZ-losartan      Advance Care Planning:   Code Status: Full Code  Consults: Sent message to Dr. Levora Angel  Family Communication: No family in room  Severity of Illness: The appropriate patient status for this patient is INPATIENT. Inpatient status is judged to be reasonable and necessary in order to provide the required intensity of service to ensure the patient's safety. The patient's presenting symptoms, physical exam findings, and initial radiographic and laboratory data in the context of their chronic comorbidities is felt to place them at high risk for further  clinical deterioration. Furthermore, it is not anticipated that the patient will be medically stable for discharge from the hospital within 2 midnights of admission.   * I certify that at the point of admission it is my clinical judgment that the patient will require inpatient hospital care spanning beyond 2 midnights from the point of admission due to high intensity of service, high risk for further deterioration and high frequency of surveillance required.*  Author: Hillary Bow., DO 06/30/2021 3:34 AM  For on call review [email or web address].

## 2021-06-30 NOTE — ED Notes (Signed)
Reminded [facility] at 1:25 that Dr Bernette Mayers still did not hear from hospitalist and they have been paged two times this will be third

## 2021-06-30 NOTE — Discharge Instructions (Signed)
[facility] ° °Please arrive at least 30 min before your appointment to complete your check in paperwork.  If you are unable to arrive 30 min prior to your appointment time we may have to cancel or reschedule you. °LAPAROSCOPIC SURGERY: POST OP INSTRUCTIONS °Always review your discharge instruction sheet given to you by the facility where your surgery was performed. °IF YOU HAVE DISABILITY OR FAMILY LEAVE FORMS, YOU MUST BRING THEM TO THE OFFICE FOR PROCESSING.   °DO NOT GIVE THEM TO YOUR DOCTOR. ° °PAIN CONTROL ° °First take acetaminophen (Tylenol) AND/or ibuprofen (Advil) to control your pain after surgery.  Follow directions on package.  Taking acetaminophen (Tylenol) and/or ibuprofen (Advil) regularly after surgery will help to control your pain and lower the amount of prescription pain medication you may need.  You should not take more than 4,000 mg (4 grams) of acetaminophen (Tylenol) in 24 hours.  You should not take ibuprofen (Advil), aleve, motrin, naprosyn or other NSAIDS if you have a history of stomach ulcers or chronic kidney disease.  °A prescription for pain medication may be given to you upon discharge.  Take your pain medication as prescribed, if you still have uncontrolled pain after taking acetaminophen (Tylenol) or ibuprofen (Advil). °Use ice packs to help control pain. °If you need a refill on your pain medication, please contact your pharmacy.  They will contact our office to request authorization. Prescriptions will not be filled after 5pm or on week-ends. ° °HOME MEDICATIONS °Take your usually prescribed medications unless otherwise directed. ° °DIET °You should follow a light diet the first few days after arrival home.  Be sure to include lots of fluids daily. Avoid fatty, fried foods.  ° °CONSTIPATION °It is common to experience some constipation after surgery and if you are taking pain medication.  Increasing fluid intake and taking a stool softener (such as Colace)  will usually help or prevent this problem from occurring.  A mild laxative (Milk of Magnesia or Miralax) should be taken according to package instructions if there are no bowel movements after 48 hours. ° °WOUND/INCISION CARE °Most patients will experience some swelling and bruising in the area of the incisions.  Ice packs will help.  Swelling and bruising can take several days to resolve.  °Unless discharge instructions indicate otherwise, follow guidelines below  °STERI-STRIPS - you may remove your outer bandages 48 hours after surgery, and you may shower at that time.  You have steri-strips (small skin tapes) in place directly over the incision.  These strips should be left on the skin for 7-10 days.   °DERMABOND/SKIN GLUE - you may shower in 24 hours.  The glue will flake off over the next 2-3 weeks. °Any sutures or staples will be removed at the office during your follow-up visit. ° °ACTIVITIES °You may resume regular (light) daily activities beginning the next day--such as daily self-care, walking, climbing stairs--gradually increasing activities as tolerated.  You may have sexual intercourse when it is comfortable.  Refrain from any heavy lifting or straining until approved by your doctor. °You may drive when you are no longer taking prescription pain medication, you can comfortably wear a seatbelt, and you can safely maneuver your car and apply brakes. ° °FOLLOW-UP °You should see your doctor in the office for a follow-up appointment approximately 2-3 weeks after your surgery.  You should have been given your post-op/follow-up appointment when your surgery was scheduled.  If you did not receive a post-op/follow-up appointment, make sure   that you call for this appointment within a day or two after you arrive home to insure a convenient appointment time. ° ° °WHEN TO CALL YOUR DOCTOR: °Fever over 101.0 °Inability to urinate °Continued bleeding from incision. °Increased pain, redness, or drainage from the  incision. °Increasing abdominal pain ° °The clinic staff is available to answer your questions during regular business hours.  Please don’t hesitate to call and ask to speak to one of the nurses for clinical concerns.  If you have a medical emergency, go to the nearest emergency room or call 911.  A surgeon from [facility] is always on call at the hospital. °[address], [address], [place], [place]  [address] ? [address] [address], [place], [place]   [address] °[phone] ? [phone] ? FAX [phone] ° ° ° ° °Managing Your Pain After Surgery Without Opioids ° ° ° °Thank you for participating in our program to help patients manage their pain after surgery without opioids. This is part of our effort to provide you with the best care possible, without exposing you or your family to the risk that opioids pose. ° °What pain can I expect after surgery? °You can expect to have some pain after surgery. This is normal. The pain is typically worse the day after surgery, and quickly begins to get better. °Many studies have found that many patients are able to manage their pain after surgery with Over-the-Counter (OTC) medications such as Tylenol and Motrin. If you have a condition that does not allow you to take Tylenol or Motrin, notify your surgical team. ° °How will I manage my pain? °The best strategy for controlling your pain after surgery is around the clock pain control with Tylenol (acetaminophen) and Motrin (ibuprofen or Advil). Alternating these medications with each other allows you to maximize your pain control. In addition to Tylenol and Motrin, you can use heating pads or ice packs on your incisions to help reduce your pain. ° °How will I alternate your regular strength over-the-counter pain medication? °You will take a dose of pain medication every three hours. °Start by taking 650 mg of Tylenol (2 pills of 325 mg) °3 hours later take 600 mg of Motrin (3 pills of 200 mg) °3 hours after  taking the Motrin take 650 mg of Tylenol °3 hours after that take 600 mg of Motrin. ° ° °- 1 - ° °See example - if your first dose of Tylenol is at 12:00 PM ° ° °12:00 PM Tylenol 650 mg (2 pills of 325 mg)  °3:00 PM Motrin 600 mg (3 pills of 200 mg)  °6:00 PM Tylenol 650 mg (2 pills of 325 mg)  °9:00 PM Motrin 600 mg (3 pills of 200 mg)  °Continue alternating every 3 hours  ° °We recommend that you follow this schedule around-the-clock for at least 3 days after surgery, or until you feel that it is no longer needed. Use the table on the last page of this handout to keep track of the medications you are taking. °Important: °Do not take more than 3000mg of Tylenol or 3200mg of Motrin in a 24-hour period. °Do not take ibuprofen/Motrin if you have a history of bleeding stomach ulcers, severe kidney disease, &/or actively taking a blood thinner ° °What if I still have pain? °If you have pain that is not controlled with the over-the-counter pain medications (Tylenol and Motrin or Advil) you might have what we call “breakthrough” pain. You will receive a prescription   for a small amount of an opioid pain medication such as Oxycodone, Tramadol, or Tylenol with Codeine. Use these opioid pills in the first 24 hours after surgery if you have breakthrough pain. Do not take more than 1 pill every 4-6 hours. ° °If you still have uncontrolled pain after using all opioid pills, don't hesitate to call our staff using the number provided. We will help make sure you are managing your pain in the best way possible, and if necessary, we can provide a prescription for additional pain medication. ° ° °Day 1   ° °Time  °Name of Medication Number of pills taken  °Amount of Acetaminophen  °Pain Level  ° °Comments  °AM PM       °AM PM       °AM PM       °AM PM       °AM PM       °AM PM       °AM PM       °AM PM       °Total Daily amount of Acetaminophen °Do not take more than  3,000 mg per day    ° ° °Day 2   ° °Time  °Name of Medication  Number of pills °taken  °Amount of Acetaminophen  °Pain Level  ° °Comments  °AM PM       °AM PM       °AM PM       °AM PM       °AM PM       °AM PM       °AM PM       °AM PM       °Total Daily amount of Acetaminophen °Do not take more than  3,000 mg per day    ° ° °Day 3   ° °Time  °Name of Medication Number of pills taken  °Amount of Acetaminophen  °Pain Level  ° °Comments  °AM PM       °AM PM       °AM PM       °AM PM       ° ° ° °AM PM       °AM PM       °AM PM       °AM PM       °Total Daily amount of Acetaminophen °Do not take more than  3,000 mg per day    ° ° °Day 4   ° °Time  °Name of Medication Number of pills taken  °Amount of Acetaminophen  °Pain Level  ° °Comments  °AM PM       °AM PM       °AM PM       °AM PM       °AM PM       °AM PM       °AM PM       °AM PM       °Total Daily amount of Acetaminophen °Do not take more than  3,000 mg per day    ° ° °Day 5   ° °Time  °Name of Medication Number °of pills taken  °Amount of Acetaminophen  °Pain Level  ° °Comments  °AM PM       °AM PM       °AM PM       °AM PM       °AM PM       °AM   PM       °AM PM       °AM PM       °Total Daily amount of Acetaminophen °Do not take more than  3,000 mg per day    ° ° ° °Day 6   ° °Time  °Name of Medication Number of pills °taken  °Amount of Acetaminophen  °Pain Level  °Comments  °AM PM       °AM PM       °AM PM       °AM PM       °AM PM       °AM PM       °AM PM       °AM PM       °Total Daily amount of Acetaminophen °Do not take more than  3,000 mg per day    ° ° °Day 7   ° °Time  °Name of Medication Number of pills taken  °Amount of Acetaminophen  °Pain Level  ° °Comments  °AM PM       °AM PM       °AM PM       °AM PM       °AM PM       °AM PM       °AM PM       °AM PM       °Total Daily amount of Acetaminophen °Do not take more than  3,000 mg per day    ° ° ° ° °For additional information about how and where to safely dispose of unused opioid °medications - https://www.morepowerfulnc.org ° °Disclaimer: This document  contains information and/or instructional materials adapted from [facility] for the typical patient with your condition. It does not replace medical advice from your health care provider because your experience may differ from that of the °typical patient. Talk to your health care provider if you have any questions about this °document, your condition or your treatment plan. °Adapted from [facility] ° °

## 2021-06-30 NOTE — Hospital Course (Signed)
66 year old woman presenting with epigastric abdominal pain.  Admitted for acute pancreatitis. Further evaluation revealed cholelithiasis without evidence of acute cholecystitis, MRCP showed pancreatitis without acute complicating features.  No choledocholithiasis.  Improving. General surgery consulted with plans for laparoscopic cholecystectomy 5/27.

## 2021-06-30 NOTE — H&P (View-Only) (Signed)
Consult Note  Tiffany Cunningham 10/17/1955  [id number].    Requesting MD: Murray Hodgkins, MD Chief Complaint/Reason for Consult: Acute biliary pancreatitis  HPI:  Patient is a 66 year old female who presented to [facility] yesterday with upper abdominal pain. Pain began 1 day prior to presentation after eating a normal meal.  Her pain was in her epigastrium and did not radiate anywhere.  No pain in RUQ.  She admits to a mild low grade fever.  She has had associated nausea and vomiting, but this has resolved. Denies diarrhea or constipation. Denies similar previous pain.  Given persistent pain, she presented to the ED where she had elevation of her LFTs (except normal TB) as well as well as mild elevation in her lipase at 374.  WBC mildly elevated at 13, has normalized today.  She underwent CT, [place], and MRCP which reveals gallstones and inflammation of her pancreas.  The patient is continuing to still have some epigastric pain, but is slowly improving.  We have been asked to see her for further evaluation and recommendations.   PMH otherwise significant for mitral valve prolapse and aortic atherosclerosis. Prior abdominal surgery includes open appendectomy. Allergic to ACE-I. Denies alcohol, tobacco, or illicit drug use.   ROS: Negative other than in HPI  Family History  Problem Relation Age of Onset   Allergic rhinitis Mother    Allergic rhinitis Sister    Asthma Sister    Asthma Cousin    Eczema Daughter    Urticaria Neg Hx    Immunodeficiency Neg Hx     Past Medical History:  Diagnosis Date   Aortic atherosclerosis ([place]) 06/30/2021   Mitral valve prolapse    Question of MVP in 2017 though looks like they didnt see this on repeat echo according to cards 2021 office note.   Urticaria     Past Surgical History:  Procedure Laterality Date   APPENDECTOMY  02/05/1977   HIP SURGERY Left    OSTEOTOMY  02/06/1991   SHOULDER SURGERY Left     Social History:  reports that she has  never smoked. She has never used smokeless tobacco. She reports that she does not drink alcohol and does not use drugs.  Allergies:  Allergies  Allergen Reactions   Ace Inhibitors Cough    Medications Prior to Admission  Medication Sig Dispense Refill   amLODipine (NORVASC) 5 MG tablet Take 5 mg by mouth daily.     Azelastine-Fluticasone 137-50 MCG/ACT SUSP Place 1 spray into the nose 2 (two) times daily as needed. (Patient taking differently: Place 1 spray into the nose 2 (two) times daily as needed (allergies).) 23 g 5   Calcium Carbonate Antacid (TUMS PO) Take 1 tablet by mouth 2 (two) times daily as needed (stomach upset).     diclofenac (VOLTAREN) 75 MG EC tablet Take 75 mg by mouth 2 (two) times daily.     DULoxetine (CYMBALTA) 60 MG capsule Take 60 mg by mouth daily.     losartan-hydrochlorothiazide (HYZAAR) 100-25 MG tablet Take 1 tablet by mouth daily.     Magnesium Oxide 250 MG TABS Take 250 mg by mouth at bedtime.     Multiple Vitamin (MULTIVITAMIN WITH MINERALS) TABS tablet Take 1 tablet by mouth daily.     Omega-3 Fatty Acids (FISH OIL) 1000 MG CAPS Take 3 capsules by mouth daily.     Polyethyl Glycol-Propyl Glycol (SYSTANE OP) Place 1 drop into both eyes in the morning and at  bedtime.     acetaminophen (TYLENOL) 325 MG tablet Take 325-650 mg by mouth every 6 (six) hours as needed (for pain). (Patient not taking: Reported on 06/30/2021)     magnesium oxide (MAG-OX) 400 MG tablet Take 400 mg by mouth daily. (Patient not taking: Reported on 06/30/2021)      Blood pressure 131/63, pulse 98, temperature 98.4 F (36.9 C), temperature source Oral, resp. rate 16, height 5\' 4"  (1.626 m), weight 74.3 kg, SpO2 90 %. Physical Exam:  General: pleasant, WD, WN female who is laying in bed in NAD HEENT: head is normocephalic, atraumatic.  Sclera are anicteric.  PERRL.  Ears and nose without any masses or lesions.  Mouth is pink and moist Heart: regular, rate, and rhythm.  Normal s1,s2. No  obvious gallops, or rubs noted. +murmur. Palpable radial and pedal pulses bilaterally Lungs: CTAB, no wheezes, rhonchi, or rales noted.  Respiratory effort nonlabored Abd: soft, still fairly tender in her epigastrium where she jumped when I palpated this area, ND, +BS, no masses, hernias, or organomegaly Psych: A&Ox3 with an appropriate affect.   Results for orders placed or performed during the hospital encounter of 06/29/21 (from the past 48 hour(s))  Lipase, blood     Status: Abnormal   Collection Time: 06/29/21 10:31 PM  Result Value Ref Range   Lipase 374 (H) 11 - 51 U/L    Comment: Performed at 07/01/21, [address] [place], [place]  Comprehensive metabolic panel     Status: Abnormal   Collection Time: 06/29/21 10:31 PM  Result Value Ref Range   Sodium 140 135 - 145 mmol/L   Potassium 3.2 (L) 3.5 - 5.1 mmol/L   Chloride 99 98 - 111 mmol/L   CO2 30 22 - 32 mmol/L   Glucose, Bld 118 (H) 70 - 99 mg/dL    Comment: Glucose reference range applies only to samples taken after fasting for at least 8 hours.   BUN 18 8 - 23 mg/dL   Creatinine, Ser 07/01/21 0.44 - 1.00 mg/dL   Calcium 9.4 8.9 - 0.77 mg/dL   Total Protein 7.3 6.5 - 8.1 g/dL   Albumin 4.2 3.5 - 5.0 g/dL   AST 00.0 (H) 15 - 41 U/L   ALT 630 (H) 0 - 44 U/L   Alkaline Phosphatase 127 (H) 38 - 126 U/L   Total Bilirubin 0.9 0.3 - 1.2 mg/dL   GFR, Estimated 574 >49 mL/min    Comment: (NOTE) Calculated using the CKD-EPI Creatinine Equation (2021)    Anion gap 11 5 - 15    Comment: Performed at [birth year]-1972, [address], [place], [place]  CBC     Status: Abnormal   Collection Time: 06/29/21 10:31 PM  Result Value Ref Range   WBC 13.0 (H) 4.0 - 10.5 K/uL   RBC 4.57 3.87 - 5.11 MIL/uL   Hemoglobin 13.1 12.0 - 15.0 g/dL   HCT 07/01/21 78.4 - 12.7 %   MCV 86.9 80.0 - 100.0 fL   MCH 28.7 26.0 - 34.0 pg   MCHC 33.0 30.0 - 36.0 g/dL   RDW 17.9 14.2 - 12.9 %    Platelets 327 150 - 400 K/uL   nRBC 0.0 0.0 - 0.2 %    Comment: Performed at 24.8, [address] [place], [place]  Urinalysis, Routine w reflex microscopic Urine, Clean Catch     Status: Abnormal   Collection Time: 06/29/21 10:31 PM  Result Value Ref Range   Color, Urine YELLOW YELLOW   APPearance HAZY (A) CLEAR   Specific Gravity, Urine 1.016 1.005 - 1.030   pH 8.0 5.0 - 8.0   Glucose, UA NEGATIVE NEGATIVE mg/dL   Hgb urine dipstick NEGATIVE NEGATIVE   Bilirubin Urine NEGATIVE NEGATIVE   Ketones, ur 15 (A) NEGATIVE mg/dL   Protein, ur TRACE (A) NEGATIVE mg/dL   Nitrite NEGATIVE NEGATIVE   Leukocytes,Ua LARGE (A) NEGATIVE   RBC / HPF 0-5 0 - 5 RBC/hpf   WBC, UA >50 (H) 0 - 5 WBC/hpf   Bacteria, UA FEW (A) NONE SEEN   Squamous Epithelial / LPF 6-10 0 - 5   Mucus PRESENT    Non Squamous Epithelial 0-5 (A) NONE SEEN    Comment: Performed at [facility], [place], [place] [address]  HIV Antibody (routine testing w rflx)     Status: None   Collection Time: 06/30/21  4:00 AM  Result Value Ref Range   HIV Screen 4th Generation wRfx Non Reactive Non Reactive    Comment: Performed at [facility], [address] [place], [place] [address]  CBC     Status: Abnormal   Collection Time: 06/30/21  4:00 AM  Result Value Ref Range   WBC 7.9 4.0 - 10.5 K/uL   RBC 4.02 3.87 - 5.11 MIL/uL   Hemoglobin 11.8 (L) 12.0 - 15.0 g/dL   HCT 36.3 36.0 - 46.0 %   MCV 90.3 80.0 - 100.0 fL   MCH 29.4 26.0 - 34.0 pg   MCHC 32.5 30.0 - 36.0 g/dL   RDW 12.4 11.5 - 15.5 %   Platelets 255 150 - 400 K/uL   nRBC 0.0 0.0 - 0.2 %    Comment: Performed at [facility], [place] [address] [place], [place] [address]  Comprehensive metabolic panel     Status: Abnormal   Collection Time: 06/30/21  4:00 AM  Result Value Ref Range   Sodium 142 135 - 145 mmol/L   Potassium 3.4 (L) 3.5 - 5.1 mmol/L   Chloride 104 98 -  111 mmol/L   CO2 30 22 - 32 mmol/L   Glucose, Bld 116 (H) 70 - 99 mg/dL    Comment: Glucose reference range applies only to samples taken after fasting for at least 8 hours.   BUN 16 8 - 23 mg/dL   Creatinine, Ser 0.59 0.44 - 1.00 mg/dL   Calcium 8.7 (L) 8.9 - 10.3 mg/dL   Total Protein 6.7 6.5 - 8.1 g/dL   Albumin 3.7 3.5 - 5.0 g/dL   AST 262 (H) 15 - 41 U/L   ALT 536 (H) 0 - 44 U/L   Alkaline Phosphatase 103 38 - 126 U/L   Total Bilirubin 0.8 0.3 - 1.2 mg/dL   GFR, Estimated >60 >60 mL/min    Comment: (NOTE) Calculated using the CKD-EPI Creatinine Equation (2021)    Anion gap 8 5 - 15    Comment: Performed at [facility], [place] [address], [place], [place] [address]   CT ABDOMEN PELVIS W CONTRAST  Result Date: 06/29/2021 CLINICAL DATA:  Left upper quadrant abdominal pain EXAM: CT ABDOMEN AND PELVIS WITH CONTRAST TECHNIQUE: Multidetector CT imaging of the abdomen and pelvis was performed using the standard protocol following bolus administration of intravenous contrast. RADIATION DOSE REDUCTION: This exam was performed according to the departmental dose-optimization program which includes automated exposure control, adjustment of the mA and/or  kV according to patient size and/or use of iterative reconstruction technique. CONTRAST:  58mL OMNIPAQUE IOHEXOL 300 MG/ML  SOLN COMPARISON:  None Available. FINDINGS: Lower chest: Visualized lung bases demonstrate discoid atelectasis. Visualized heart and pericardium are unremarkable. Hepatobiliary: Moderate hepatic steatosis. No enhancing intrahepatic mass. No intra or extrahepatic biliary ductal dilation. Gallbladder unremarkable. Pancreas: Mild peripancreatic acute inflammatory fluid is identified surrounding the head and body of the pancreas in keeping with changes of acute edematous/interstitial pancreatitis. Normal enhancement of the pancreatic parenchyma. No pancreatic or peripancreatic necrosis. The pancreatic duct is not  dilated. No loculated peripancreatic fluid collections are identified. Spleen: Unremarkable Adrenals/Urinary Tract: Adrenal glands are unremarkable. Kidneys are normal, without renal calculi, focal lesion, or hydronephrosis. Bladder is unremarkable. Stomach/Bowel: Moderate descending and sigmoid colonic diverticulosis. No superimposed acute inflammatory change. The stomach, small bowel, and large bowel are otherwise unremarkable. Appendix absent. No free intraperitoneal gas or fluid. Vascular/Lymphatic: Minimal atherosclerotic calcification within the abdominal aorta. No aortic aneurysm. Retroaortic left renal vein. Mild shotty peripancreatic and periportal adenopathy is likely reactive in nature. No frankly pathologic adenopathy within the abdomen and pelvis. Reproductive: Uterus and bilateral adnexa are unremarkable. Other: Small fat containing bilateral inguinal hernias. Musculoskeletal: No acute bone abnormality. No lytic or blastic bone lesion. IMPRESSION: Mild acute interstitial/edematous pancreatitis. No pancreatic or peripancreatic necrosis. No loculated peripancreatic fluid collections. Associated shotty probable reactive peripancreatic and periportal adenopathy. Moderate hepatic steatosis. Moderate distal colonic diverticulosis without superimposed acute inflammatory change. Aortic Atherosclerosis (ICD10-I70.0). Electronically Signed   By: Fidela Salisbury M.D.   On: 06/29/2021 23:44   MR ABDOMEN MRCP WO CONTRAST  Result Date: 06/30/2021 CLINICAL DATA:  Suspected pancreatitis. Cholelithiasis. Biliary dilatation. EXAM: MRI ABDOMEN WITHOUT CONTRAST  (INCLUDING MRCP) TECHNIQUE: Multiplanar multisequence MR imaging of the abdomen was performed. Heavily T2-weighted images of the biliary and pancreatic ducts were obtained, and three-dimensional MRCP images were rendered by post processing. COMPARISON:  CT abdomen 04/29/2021 FINDINGS: Lower chest: Bandlike atelectasis or scarring in both lower lobes.  Hepatobiliary: Innumerable small gallstones are present in the gallbladder. Common bile duct 0.6 cm in diameter, upper normal size for age. No filling defect is observed in the common bile duct. No significant hepatic parenchymal lesion is identified on noncontrast exam. Diffuse hepatic steatosis noted. Pancreas: Accentuated T2 signal in the pancreas is most striking in the pancreatic body for example on image 25 series 3. There is only subtle accentuation of diffusion restriction in this area. Otherwise there is mild peripancreatic edema. Partial pancreas divisum with a small narrowed segment of the dorsal pancreatic duct at the branch point to the minor papilla for example on image 51 series 8. No pseudocyst or abscess identified. Spleen:  Unremarkable Adrenals/Urinary Tract:  Unremarkable Stomach/Bowel: Unremarkable Vascular/Lymphatic: Likely reactive lymph nodes in the porta hepatis include a 1.2 cm portacaval node on image 22 series 3 and a 0.9 cm peripancreatic lymph node on image 22 series 3. Other:  No supplemental non-categorized findings. Musculoskeletal: Unremarkable IMPRESSION: 1. Edema signal in the pancreatic body parenchyma, along with peripancreatic edema, compatible with acute focal pancreatitis. I am skeptical of pancreatic malignancy but given the focal nature of the findings in the pancreatic body, surveillance imaging to ensure complete resolution may be warranted. No pancreatic abscess or pseudocyst currently identified. 2. Numerous tiny gallstones in the gallbladder without findings of choledocholithiasis. The common bile duct is currently 0.6 cm in diameter which is in the upper normal range. 3. Diffuse hepatic steatosis. Electronically Signed   By: Cindra Eves.D.  On: 06/30/2021 09:03   MR 3D Recon At Scanner  Result Date: 06/30/2021 CLINICAL DATA:  Suspected pancreatitis. Cholelithiasis. Biliary dilatation. EXAM: MRI ABDOMEN WITHOUT CONTRAST  (INCLUDING MRCP) TECHNIQUE:  Multiplanar multisequence MR imaging of the abdomen was performed. Heavily T2-weighted images of the biliary and pancreatic ducts were obtained, and three-dimensional MRCP images were rendered by post processing. COMPARISON:  CT abdomen 04/29/2021 FINDINGS: Lower chest: Bandlike atelectasis or scarring in both lower lobes. Hepatobiliary: Innumerable small gallstones are present in the gallbladder. Common bile duct 0.6 cm in diameter, upper normal size for age. No filling defect is observed in the common bile duct. No significant hepatic parenchymal lesion is identified on noncontrast exam. Diffuse hepatic steatosis noted. Pancreas: Accentuated T2 signal in the pancreas is most striking in the pancreatic body for example on image 25 series 3. There is only subtle accentuation of diffusion restriction in this area. Otherwise there is mild peripancreatic edema. Partial pancreas divisum with a small narrowed segment of the dorsal pancreatic duct at the branch point to the minor papilla for example on image 51 series 8. No pseudocyst or abscess identified. Spleen:  Unremarkable Adrenals/Urinary Tract:  Unremarkable Stomach/Bowel: Unremarkable Vascular/Lymphatic: Likely reactive lymph nodes in the porta hepatis include a 1.2 cm portacaval node on image 22 series 3 and a 0.9 cm peripancreatic lymph node on image 22 series 3. Other:  No supplemental non-categorized findings. Musculoskeletal: Unremarkable IMPRESSION: 1. Edema signal in the pancreatic body parenchyma, along with peripancreatic edema, compatible with acute focal pancreatitis. I am skeptical of pancreatic malignancy but given the focal nature of the findings in the pancreatic body, surveillance imaging to ensure complete resolution may be warranted. No pancreatic abscess or pseudocyst currently identified. 2. Numerous tiny gallstones in the gallbladder without findings of choledocholithiasis. The common bile duct is currently 0.6 cm in diameter which is in the  upper normal range. 3. Diffuse hepatic steatosis. Electronically Signed   By: Van Clines M.D.   On: 06/30/2021 09:03   US Abdomen Limited RUQ (LIVER/GB)  Result Date: 06/30/2021 CLINICAL DATA:  Acute pancreatitis EXAM: ULTRASOUND ABDOMEN LIMITED RIGHT UPPER QUADRANT COMPARISON:  CT 05/29/2016 FINDINGS: Gallbladder: Multiple layering gallstones are seen within the gallbladder. The gallbladder, however, is not distended, there is no gallbladder wall thickening, and no pericholecystic fluid is identified. The sonographic Percell Miller sign is reportedly negative. Common bile duct: Diameter: Dilated measuring 8 mm in proximal diameter. This appears new since prior CT examination. Liver: Hepatic parenchymal echogenicity is diffusely increased and there is diffuse coarsening of the hepatic echotexture in keeping with mild hepatic steatosis. No focal intrahepatic masses are seen and there is no intrahepatic biliary ductal dilation. Portal vein is patent on color Doppler imaging with normal direction of blood flow towards the liver. Other: None. IMPRESSION: Cholelithiasis without sonographic evidence of acute cholecystitis. Dilation of the extrahepatic bile duct. A distal obstructing lesion is not excluded. ERCP or MRCP examination may be more helpful for further evaluation. Mild hepatic steatosis. Electronically Signed   By: Fidela Salisbury M.D.   On: 06/30/2021 04:02      Assessment/Plan Biliary pancreatitis - CT 5/25: mild acute pancreatitis without necrosis or fluid collections - RUQ [place] today: cholelithiasis without evidence of cholecystitis - MRCP today: pancreatic edema, cholelithiasis, no choledocholithiasis - lipase 374 on admit, not checked today - TBili has been normal, AST/ALT 262/536 from 362/630, Alk Phos has normalized  - patient is still fairly tender in her epigastrium.  Hopefully she will be ready for OR  tomorrow, but pending her exam, may not be ready until Sunday.  Discussed with her the  reasoning behind this.  She and her family understand -recommend proceeding to OR for laparoscopic cholecystectomy when better from a pancreatitis standpoint.   FEN: CLD/NPO p MN, IVF $Remov'@200'EfvAFg$  cc/h VTE: LMWH ID: rocephin/flagyl - does not need abx from a surgical standpoint.  WBC yesterday assuredly from inflammation of her pancreas and not infection, but may be continued pending UA/urine culture  Aortic atherosclerosis  Mitral valve prolapse   I have seen and examined the patient, labs, vitals, imaging have been reviewed for last 48 hours.  I have reviewed the hospitalist and GI notes and discussed her with the GI attending.  Henreitta Cea , [facility] Surgery 06/30/2021, 10:59 AM Please see Amion for pager number during day hours 7:00am-4:30pm

## 2021-06-30 NOTE — Assessment & Plan Note (Deleted)
Question of UTI based on UA 1. Getting UCx 2. Getting rocephin / flagyl empirically for the moment anyhow in setting of question of choledocholithiasis.

## 2021-06-30 NOTE — Consult Note (Signed)
Referring Provider: [facility] [place] Primary Care Physician:  Ladora Daniel, PA-C Primary Gastroenterologist:  Gentry Fitz  Reason for Consultation: Gallstone pancreatitis  HPI: Tiffany Cunningham is a 66 y.o. female  with medical history significant of HTN.  Patient presented to the ED 06/29/2021 with severe epigastric pain.  Patient reports Wednesday night she began to feel full after eating a meal.  She attempted to take Tums before bed with no relief of fullness.  She was able to fall asleep but woke up in the middle the night with severe epigastric.  Nausea followed by several episodes of nonbilious emesis.  Denies previous pain similar to this.  Reports family history of gallbladder disease and both daughter and her mother resulting cholecystectomies.  Patient reports her sister also has autoimmune pancreatitis.  Denies previous EGD or colonoscopy.  Denies blood thinner use.  Denies frequent NSAID use.  Denies recent weight loss.  Denies sick contacts.  Denies alcohol use, smoking, illicit drug use.   Past Medical History:  Diagnosis Date   Aortic atherosclerosis (HCC) 06/30/2021   Mitral valve prolapse    Question of MVP in 2017 though looks like they didnt see this on repeat echo according to cards 2021 office note.   Urticaria     Past Surgical History:  Procedure Laterality Date   APPENDECTOMY  02/05/1977   HIP SURGERY Left    OSTEOTOMY  02/06/1991   SHOULDER SURGERY Left     Prior to Admission medications   Medication Sig Start Date End Date Taking? Authorizing Provider  amLODipine (NORVASC) 5 MG tablet Take 5 mg by mouth daily. 04/30/21  Yes [provider]  Azelastine-Fluticasone 137-50 MCG/ACT SUSP Place 1 spray into the nose 2 (two) times daily as needed. Patient taking differently: Place 1 spray into the nose 2 (two) times daily as needed (allergies). 06/12/21  Yes Ellamae Sia, DO  Calcium Carbonate Antacid (TUMS PO) Take 1 tablet by mouth 2 (two) times daily as needed (stomach  upset).   Yes [provider]  diclofenac (VOLTAREN) 75 MG EC tablet Take 75 mg by mouth 2 (two) times daily. 06/02/21  Yes [provider]  DULoxetine (CYMBALTA) 60 MG capsule Take 60 mg by mouth daily. 10/23/19  Yes [provider]  losartan-hydrochlorothiazide (HYZAAR) 100-25 MG tablet Take 1 tablet by mouth daily.   Yes [provider]  Magnesium Oxide 250 MG TABS Take 250 mg by mouth at bedtime.   Yes [provider]  Multiple Vitamin (MULTIVITAMIN WITH MINERALS) TABS tablet Take 1 tablet by mouth daily.   Yes [provider]  Omega-3 Fatty Acids (FISH OIL) 1000 MG CAPS Take 3 capsules by mouth daily.   Yes [provider]  Polyethyl Glycol-Propyl Glycol (SYSTANE OP) Place 1 drop into both eyes in the morning and at bedtime.   Yes [provider]  acetaminophen (TYLENOL) 325 MG tablet Take 325-650 mg by mouth every 6 (six) hours as needed (for pain). Patient not taking: Reported on 06/30/2021    [provider]  magnesium oxide (MAG-OX) 400 MG tablet Take 400 mg by mouth daily. Patient not taking: Reported on 06/30/2021    [provider]    Scheduled Meds:  amLODipine  5 mg Oral Daily   DULoxetine  60 mg Oral Daily   enoxaparin (LOVENOX) injection  40 mg Subcutaneous Q24H   magnesium oxide  400 mg Oral Daily   multivitamin with minerals  1 tablet Oral Daily   Continuous Infusions:  cefTRIAXone (  ROCEPHIN)  IV 200 mL/hr at 06/30/21 0500   lactated ringers 200 mL/hr at 06/30/21 3007   metronidazole 500 mg (06/30/21 0658)   PRN Meds:.acetaminophen **OR** acetaminophen, HYDROmorphone (DILAUDID) injection, ondansetron **OR** ondansetron (ZOFRAN) IV  Allergies as of 06/29/2021 - Review Complete 06/29/2021  Allergen Reaction Noted   Ace inhibitors Cough 02/23/2015    Family History  Problem Relation Age of Onset   Allergic rhinitis Mother    Allergic rhinitis Sister    Asthma Sister    Asthma  Cousin    Eczema Daughter    Urticaria Neg Hx    Immunodeficiency Neg Hx     Social History   Socioeconomic History   Marital status: Married    Spouse name: Not on file   Number of children: Not on file   Years of education: Not on file   Highest education level: Not on file  Occupational History   Not on file  Tobacco Use   Smoking status: Never   Smokeless tobacco: Never  Vaping Use   Vaping Use: Never used  Substance and Sexual Activity   Alcohol use: No    Alcohol/week: 0.0 standard drinks   Drug use: No   Sexual activity: Not on file  Other Topics Concern   Not on file  Social History Narrative   Not on file   Social Determinants of Health   Financial Resource Strain: Not on file  Food Insecurity: Not on file  Transportation Needs: Not on file  Physical Activity: Not on file  Stress: Not on file  Social Connections: Not on file  Intimate Partner Violence: Not on file    Review of Systems: Review of Systems  Constitutional:  Positive for malaise/fatigue. Negative for chills and fever.  HENT:  Negative for hearing loss and tinnitus.   Eyes:  Negative for blurred vision and double vision.  Respiratory:  Negative for cough and hemoptysis.   Cardiovascular:  Negative for chest pain and palpitations.  Gastrointestinal:  Positive for abdominal pain, nausea and vomiting. Negative for blood in stool, constipation, diarrhea, heartburn and melena.  Genitourinary:  Negative for dysuria and urgency.  Musculoskeletal:  Negative for myalgias and neck pain.  Skin:  Negative for itching and rash.  Neurological:  Positive for weakness. Negative for dizziness and headaches.  Endo/Heme/Allergies:  Negative for environmental allergies. Does not bruise/bleed easily.  Psychiatric/Behavioral:  Negative for depression, substance abuse and suicidal ideas.     Physical Exam:Physical Exam Constitutional:      General: She is not in acute distress.    Appearance: Normal  appearance. She is normal weight.  HENT:     Head: Normocephalic and atraumatic.     Right Ear: External ear normal.     Left Ear: External ear normal.     Nose: Nose normal.     Mouth/Throat:     Mouth: Mucous membranes are moist.  Eyes:     General: No scleral icterus.    Pupils: Pupils are equal, round, and reactive to light.  Cardiovascular:     Rate and Rhythm: Normal rate and regular rhythm.     Pulses: Normal pulses.     Heart sounds: Normal heart sounds.  Pulmonary:     Effort: Pulmonary effort is normal.     Breath sounds: Normal breath sounds.  Abdominal:     General: Abdomen is flat. Bowel sounds are normal. There is no distension.     Palpations: Abdomen is soft. There is no mass.  Tenderness: There is abdominal tenderness (mild epigastric). There is no guarding or rebound.     Hernia: No hernia is present.  Musculoskeletal:        General: Normal range of motion.     Cervical back: Normal range of motion and neck supple.  Skin:    General: Skin is warm and dry.  Neurological:     General: No focal deficit present.     Mental Status: She is alert and oriented to person, place, and time. Mental status is at baseline.  Psychiatric:        Mood and Affect: Mood normal.        Behavior: Behavior normal.    Vital signs: Vitals:   06/30/21 0912 06/30/21 1242  BP: 131/63 138/62  Pulse: 98 (!) 110  Resp:  19  Temp:  (!) 100.9 F (38.3 C)  SpO2:  94%   Last BM Date : 06/29/21    GI:  Lab Results: Recent Labs    06/29/21 2231 06/30/21 0400  WBC 13.0* 7.9  HGB 13.1 11.8*  HCT 39.7 36.3  PLT 327 255   BMET Recent Labs    06/29/21 2231 06/30/21 0400  NA 140 142  K 3.2* 3.4*  CL 99 104  CO2 30 30  GLUCOSE 118* 116*  BUN 18 16  CREATININE 0.68 0.59  CALCIUM 9.4 8.7*   LFT Recent Labs    06/30/21 0400  PROT 6.7  ALBUMIN 3.7  AST 262*  ALT 536*  ALKPHOS 103  BILITOT 0.8   PT/INR No results for input(s): LABPROT, INR in the last 72  hours.   Studies/Results: CT ABDOMEN PELVIS W CONTRAST  Result Date: 06/29/2021 CLINICAL DATA:  Left upper quadrant abdominal pain EXAM: CT ABDOMEN AND PELVIS WITH CONTRAST TECHNIQUE: Multidetector CT imaging of the abdomen and pelvis was performed using the standard protocol following bolus administration of intravenous contrast. RADIATION DOSE REDUCTION: This exam was performed according to the departmental dose-optimization program which includes automated exposure control, adjustment of the mA and/or kV according to patient size and/or use of iterative reconstruction technique. CONTRAST:  80mL OMNIPAQUE IOHEXOL 300 MG/ML  SOLN COMPARISON:  None Available. FINDINGS: Lower chest: Visualized lung bases demonstrate discoid atelectasis. Visualized heart and pericardium are unremarkable. Hepatobiliary: Moderate hepatic steatosis. No enhancing intrahepatic mass. No intra or extrahepatic biliary ductal dilation. Gallbladder unremarkable. Pancreas: Mild peripancreatic acute inflammatory fluid is identified surrounding the head and body of the pancreas in keeping with changes of acute edematous/interstitial pancreatitis. Normal enhancement of the pancreatic parenchyma. No pancreatic or peripancreatic necrosis. The pancreatic duct is not dilated. No loculated peripancreatic fluid collections are identified. Spleen: Unremarkable Adrenals/Urinary Tract: Adrenal glands are unremarkable. Kidneys are normal, without renal calculi, focal lesion, or hydronephrosis. Bladder is unremarkable. Stomach/Bowel: Moderate descending and sigmoid colonic diverticulosis. No superimposed acute inflammatory change. The stomach, small bowel, and large bowel are otherwise unremarkable. Appendix absent. No free intraperitoneal gas or fluid. Vascular/Lymphatic: Minimal atherosclerotic calcification within the abdominal aorta. No aortic aneurysm. Retroaortic left renal vein. Mild shotty peripancreatic and periportal adenopathy is likely  reactive in nature. No frankly pathologic adenopathy within the abdomen and pelvis. Reproductive: Uterus and bilateral adnexa are unremarkable. Other: Small fat containing bilateral inguinal hernias. Musculoskeletal: No acute bone abnormality. No lytic or blastic bone lesion. IMPRESSION: Mild acute interstitial/edematous pancreatitis. No pancreatic or peripancreatic necrosis. No loculated peripancreatic fluid collections. Associated shotty probable reactive peripancreatic and periportal adenopathy. Moderate hepatic steatosis. Moderate distal colonic diverticulosis without superimposed acute inflammatory  change. Aortic Atherosclerosis (ICD10-I70.0). Electronically Signed   By: Helyn Numbers M.D.   On: 06/29/2021 23:44   MR ABDOMEN MRCP WO CONTRAST  Result Date: 06/30/2021 CLINICAL DATA:  Suspected pancreatitis. Cholelithiasis. Biliary dilatation. EXAM: MRI ABDOMEN WITHOUT CONTRAST  (INCLUDING MRCP) TECHNIQUE: Multiplanar multisequence MR imaging of the abdomen was performed. Heavily T2-weighted images of the biliary and pancreatic ducts were obtained, and three-dimensional MRCP images were rendered by post processing. COMPARISON:  CT abdomen 04/29/2021 FINDINGS: Lower chest: Bandlike atelectasis or scarring in both lower lobes. Hepatobiliary: Innumerable small gallstones are present in the gallbladder. Common bile duct 0.6 cm in diameter, upper normal size for age. No filling defect is observed in the common bile duct. No significant hepatic parenchymal lesion is identified on noncontrast exam. Diffuse hepatic steatosis noted. Pancreas: Accentuated T2 signal in the pancreas is most striking in the pancreatic body for example on image 25 series 3. There is only subtle accentuation of diffusion restriction in this area. Otherwise there is mild peripancreatic edema. Partial pancreas divisum with a small narrowed segment of the dorsal pancreatic duct at the branch point to the minor papilla for example on image 51  series 8. No pseudocyst or abscess identified. Spleen:  Unremarkable Adrenals/Urinary Tract:  Unremarkable Stomach/Bowel: Unremarkable Vascular/Lymphatic: Likely reactive lymph nodes in the porta hepatis include a 1.2 cm portacaval node on image 22 series 3 and a 0.9 cm peripancreatic lymph node on image 22 series 3. Other:  No supplemental non-categorized findings. Musculoskeletal: Unremarkable IMPRESSION: 1. Edema signal in the pancreatic body parenchyma, along with peripancreatic edema, compatible with acute focal pancreatitis. I am skeptical of pancreatic malignancy but given the focal nature of the findings in the pancreatic body, surveillance imaging to ensure complete resolution may be warranted. No pancreatic abscess or pseudocyst currently identified. 2. Numerous tiny gallstones in the gallbladder without findings of choledocholithiasis. The common bile duct is currently 0.6 cm in diameter which is in the upper normal range. 3. Diffuse hepatic steatosis. Electronically Signed   By: Gaylyn Rong M.D.   On: 06/30/2021 09:03   MR 3D Recon At Scanner  Result Date: 06/30/2021 CLINICAL DATA:  Suspected pancreatitis. Cholelithiasis. Biliary dilatation. EXAM: MRI ABDOMEN WITHOUT CONTRAST  (INCLUDING MRCP) TECHNIQUE: Multiplanar multisequence MR imaging of the abdomen was performed. Heavily T2-weighted images of the biliary and pancreatic ducts were obtained, and three-dimensional MRCP images were rendered by post processing. COMPARISON:  CT abdomen 04/29/2021 FINDINGS: Lower chest: Bandlike atelectasis or scarring in both lower lobes. Hepatobiliary: Innumerable small gallstones are present in the gallbladder. Common bile duct 0.6 cm in diameter, upper normal size for age. No filling defect is observed in the common bile duct. No significant hepatic parenchymal lesion is identified on noncontrast exam. Diffuse hepatic steatosis noted. Pancreas: Accentuated T2 signal in the pancreas is most striking in the  pancreatic body for example on image 25 series 3. There is only subtle accentuation of diffusion restriction in this area. Otherwise there is mild peripancreatic edema. Partial pancreas divisum with a small narrowed segment of the dorsal pancreatic duct at the branch point to the minor papilla for example on image 51 series 8. No pseudocyst or abscess identified. Spleen:  Unremarkable Adrenals/Urinary Tract:  Unremarkable Stomach/Bowel: Unremarkable Vascular/Lymphatic: Likely reactive lymph nodes in the porta hepatis include a 1.2 cm portacaval node on image 22 series 3 and a 0.9 cm peripancreatic lymph node on image 22 series 3. Other:  No supplemental non-categorized findings. Musculoskeletal: Unremarkable IMPRESSION: 1. Edema signal in the  pancreatic body parenchyma, along with peripancreatic edema, compatible with acute focal pancreatitis. I am skeptical of pancreatic malignancy but given the focal nature of the findings in the pancreatic body, surveillance imaging to ensure complete resolution may be warranted. No pancreatic abscess or pseudocyst currently identified. 2. Numerous tiny gallstones in the gallbladder without findings of choledocholithiasis. The common bile duct is currently 0.6 cm in diameter which is in the upper normal range. 3. Diffuse hepatic steatosis. Electronically Signed   By: Gaylyn RongWalter  Liebkemann M.D.   On: 06/30/2021 09:03   US Abdomen Limited RUQ (LIVER/GB)  Result Date: 06/30/2021 CLINICAL DATA:  Acute pancreatitis EXAM: ULTRASOUND ABDOMEN LIMITED RIGHT UPPER QUADRANT COMPARISON:  CT 05/29/2016 FINDINGS: Gallbladder: Multiple layering gallstones are seen within the gallbladder. The gallbladder, however, is not distended, there is no gallbladder wall thickening, and no pericholecystic fluid is identified. The sonographic Eulah PontMurphy sign is reportedly negative. Common bile duct: Diameter: Dilated measuring 8 mm in proximal diameter. This appears new since prior CT examination. Liver:  Hepatic parenchymal echogenicity is diffusely increased and there is diffuse coarsening of the hepatic echotexture in keeping with mild hepatic steatosis. No focal intrahepatic masses are seen and there is no intrahepatic biliary ductal dilation. Portal vein is patent on color Doppler imaging with normal direction of blood flow towards the liver. Other: None. IMPRESSION: Cholelithiasis without sonographic evidence of acute cholecystitis. Dilation of the extrahepatic bile duct. A distal obstructing lesion is not excluded. ERCP or MRCP examination may be more helpful for further evaluation. Mild hepatic steatosis. Electronically Signed   By: Helyn NumbersAshesh  Parikh M.D.   On: 06/30/2021 04:02    Impression: Suspected gallstone pancreatitis Elevated LFTs  RUQ ultrasound 06/30/2021 Cholelithiasis without sonographic evidence of acute cholecystitis. Dilation of the extrahepatic bile duct.   MRCP 06/30/2021 1. Edema signal in the pancreatic body parenchyma, along with peripancreatic edema, compatible with acute focal pancreatitis.No pancreatic abscess or pseudocyst currently identified. 2. Numerous tiny gallstones in the gallbladder without findings of choledocholithiasis. The common bile duct is currently 0.6 cm in diameter which is in the upper normal range. 3. Diffuse hepatic steatosis.   Assessment with possible gallstone pancreatitis with passed gallstone.  No choledocholithiasis found on MRCP.  Cholelithiasis found on right upper quadrant ultrasound.  Patient may benefit from cholecystectomy.  LFTs improving abdominal pain improving.  Will evaluate for possible hypertriglyceridemia as cause of pancreatitis or autoimmune pancreatitis given family history.  HGB 11.8 Platelets 255 AST 262(362) ALT 536(630)  Alkphos 103(127) TBili 0.8   Plan: Continue supportive care Consulted surgery for possible cholecystectomy. We will order IgG4 and ANA for possible autoimmune pancreatitis given family history.  Will  order triglycerides as well. [facility] will follow.  LOS: 0 days   Emmit AlexandersGabrielle N Jayden Rudge  PA-C 06/30/2021, 1:29 PM  Contact #  [phone]

## 2021-06-30 NOTE — Assessment & Plan Note (Signed)
--  can consider statin as outpatient

## 2021-06-30 NOTE — Consult Note (Signed)
Consult Note  Tiffany Cunningham [birth year]  [id number].    Requesting MD: Murray Hodgkins, MD Chief Complaint/Reason for Consult: Acute biliary pancreatitis  HPI:  Patient is a 66 year old female who presented to [facility] yesterday with upper abdominal pain. Pain began 1 day prior to presentation after eating a normal meal.  Her pain was in her epigastrium and did not radiate anywhere.  No pain in RUQ.  She admits to a mild low grade fever.  She has had associated nausea and vomiting, but this has resolved. Denies diarrhea or constipation. Denies similar previous pain.  Given persistent pain, she presented to the ED where she had elevation of her LFTs (except normal TB) as well as well as mild elevation in her lipase at 374.  WBC mildly elevated at 13, has normalized today.  She underwent CT, [place], and MRCP which reveals gallstones and inflammation of her pancreas.  The patient is continuing to still have some epigastric pain, but is slowly improving.  We have been asked to see her for further evaluation and recommendations.   PMH otherwise significant for mitral valve prolapse and aortic atherosclerosis. Prior abdominal surgery includes open appendectomy. Allergic to ACE-I. Denies alcohol, tobacco, or illicit drug use.   ROS: Negative other than in HPI  Family History  Problem Relation Age of Onset   Allergic rhinitis Mother    Allergic rhinitis Sister    Asthma Sister    Asthma Cousin    Eczema Daughter    Urticaria Neg Hx    Immunodeficiency Neg Hx     Past Medical History:  Diagnosis Date   Aortic atherosclerosis ([place]) 06/30/2021   Mitral valve prolapse    Question of MVP in 2017 though looks like they didnt see this on repeat echo according to cards 2021 office note.   Urticaria     Past Surgical History:  Procedure Laterality Date   APPENDECTOMY  02/05/1977   HIP SURGERY Left    OSTEOTOMY  02/06/1991   SHOULDER SURGERY Left     Social History:  reports that she has  never smoked. She has never used smokeless tobacco. She reports that she does not drink alcohol and does not use drugs.  Allergies:  Allergies  Allergen Reactions   Ace Inhibitors Cough    Medications Prior to Admission  Medication Sig Dispense Refill   amLODipine (NORVASC) 5 MG tablet Take 5 mg by mouth daily.     Azelastine-Fluticasone 137-50 MCG/ACT SUSP Place 1 spray into the nose 2 (two) times daily as needed. (Patient taking differently: Place 1 spray into the nose 2 (two) times daily as needed (allergies).) 23 g 5   Calcium Carbonate Antacid (TUMS PO) Take 1 tablet by mouth 2 (two) times daily as needed (stomach upset).     diclofenac (VOLTAREN) 75 MG EC tablet Take 75 mg by mouth 2 (two) times daily.     DULoxetine (CYMBALTA) 60 MG capsule Take 60 mg by mouth daily.     losartan-hydrochlorothiazide (HYZAAR) 100-25 MG tablet Take 1 tablet by mouth daily.     Magnesium Oxide 250 MG TABS Take 250 mg by mouth at bedtime.     Multiple Vitamin (MULTIVITAMIN WITH MINERALS) TABS tablet Take 1 tablet by mouth daily.     Omega-3 Fatty Acids (FISH OIL) 1000 MG CAPS Take 3 capsules by mouth daily.     Polyethyl Glycol-Propyl Glycol (SYSTANE OP) Place 1 drop into both eyes in the morning and at  bedtime.     acetaminophen (TYLENOL) 325 MG tablet Take 325-650 mg by mouth every 6 (six) hours as needed (for pain). (Patient not taking: Reported on 06/30/2021)     magnesium oxide (MAG-OX) 400 MG tablet Take 400 mg by mouth daily. (Patient not taking: Reported on 06/30/2021)      Blood pressure 131/63, pulse 98, temperature 98.4 F (36.9 C), temperature source Oral, resp. rate 16, height 5\' 4"  (1.626 m), weight 74.3 kg, SpO2 90 %. Physical Exam:  General: pleasant, WD, WN female who is laying in bed in NAD HEENT: head is normocephalic, atraumatic.  Sclera are anicteric.  PERRL.  Ears and nose without any masses or lesions.  Mouth is pink and moist Heart: regular, rate, and rhythm.  Normal s1,s2. No  obvious gallops, or rubs noted. +murmur. Palpable radial and pedal pulses bilaterally Lungs: CTAB, no wheezes, rhonchi, or rales noted.  Respiratory effort nonlabored Abd: soft, still fairly tender in her epigastrium where she jumped when I palpated this area, ND, +BS, no masses, hernias, or organomegaly Psych: A&Ox3 with an appropriate affect.   Results for orders placed or performed during the hospital encounter of 06/29/21 (from the past 48 hour(s))  Lipase, blood     Status: Abnormal   Collection Time: 06/29/21 10:31 PM  Result Value Ref Range   Lipase 374 (H) 11 - 51 U/L    Comment: Performed at 07/01/21, [address] [place], [place]  Comprehensive metabolic panel     Status: Abnormal   Collection Time: 06/29/21 10:31 PM  Result Value Ref Range   Sodium 140 135 - 145 mmol/L   Potassium 3.2 (L) 3.5 - 5.1 mmol/L   Chloride 99 98 - 111 mmol/L   CO2 30 22 - 32 mmol/L   Glucose, Bld 118 (H) 70 - 99 mg/dL    Comment: Glucose reference range applies only to samples taken after fasting for at least 8 hours.   BUN 18 8 - 23 mg/dL   Creatinine, Ser 07/01/21 0.44 - 1.00 mg/dL   Calcium 9.4 8.9 - 5.42 mg/dL   Total Protein 7.3 6.5 - 8.1 g/dL   Albumin 4.2 3.5 - 5.0 g/dL   AST 52.0 (H) 15 - 41 U/L   ALT 630 (H) 0 - 44 U/L   Alkaline Phosphatase 127 (H) 38 - 126 U/L   Total Bilirubin 0.9 0.3 - 1.2 mg/dL   GFR, Estimated 538 >42 mL/min    Comment: (NOTE) Calculated using the CKD-EPI Creatinine Equation (2021)    Anion gap 11 5 - 15    Comment: Performed at 02-01-1970, [address] [place], [place]  CBC     Status: Abnormal   Collection Time: 06/29/21 10:31 PM  Result Value Ref Range   WBC 13.0 (H) 4.0 - 10.5 K/uL   RBC 4.57 3.87 - 5.11 MIL/uL   Hemoglobin 13.1 12.0 - 15.0 g/dL   HCT 07/01/21 63.3 - 51.1 %   MCV 86.9 80.0 - 100.0 fL   MCH 28.7 26.0 - 34.0 pg   MCHC 33.0 30.0 - 36.0 g/dL   RDW 50.5 32.4 - 14.7 %    Platelets 327 150 - 400 K/uL   nRBC 0.0 0.0 - 0.2 %    Comment: Performed at [facility], [address], [place], [place]  Urinalysis, Routine w reflex microscopic Urine, Clean Catch     Status: Abnormal   Collection Time: 06/29/21 10:31 PM  Result Value Ref Range   Color, Urine YELLOW YELLOW   APPearance HAZY (A) CLEAR   Specific Gravity, Urine 1.016 1.005 - 1.030   pH 8.0 5.0 - 8.0   Glucose, UA NEGATIVE NEGATIVE mg/dL   Hgb urine dipstick NEGATIVE NEGATIVE   Bilirubin Urine NEGATIVE NEGATIVE   Ketones, ur 15 (A) NEGATIVE mg/dL   Protein, ur TRACE (A) NEGATIVE mg/dL   Nitrite NEGATIVE NEGATIVE   Leukocytes,Ua LARGE (A) NEGATIVE   RBC / HPF 0-5 0 - 5 RBC/hpf   WBC, UA >50 (H) 0 - 5 WBC/hpf   Bacteria, UA FEW (A) NONE SEEN   Squamous Epithelial / LPF 6-10 0 - 5   Mucus PRESENT    Non Squamous Epithelial 0-5 (A) NONE SEEN    Comment: Performed at [facility], [place], [place] [address]  HIV Antibody (routine testing w rflx)     Status: None   Collection Time: 06/30/21  4:00 AM  Result Value Ref Range   HIV Screen 4th Generation wRfx Non Reactive Non Reactive    Comment: Performed at [facility], [address], [place], [place] [address]  CBC     Status: Abnormal   Collection Time: 06/30/21  4:00 AM  Result Value Ref Range   WBC 7.9 4.0 - 10.5 K/uL   RBC 4.02 3.87 - 5.11 MIL/uL   Hemoglobin 11.8 (L) 12.0 - 15.0 g/dL   HCT 36.3 36.0 - 46.0 %   MCV 90.3 80.0 - 100.0 fL   MCH 29.4 26.0 - 34.0 pg   MCHC 32.5 30.0 - 36.0 g/dL   RDW 12.4 11.5 - 15.5 %   Platelets 255 150 - 400 K/uL   nRBC 0.0 0.0 - 0.2 %    Comment: Performed at [facility], [place] [address] [place], [place] [address]  Comprehensive metabolic panel     Status: Abnormal   Collection Time: 06/30/21  4:00 AM  Result Value Ref Range   Sodium 142 135 - 145 mmol/L   Potassium 3.4 (L) 3.5 - 5.1 mmol/L   Chloride 104 98 -  111 mmol/L   CO2 30 22 - 32 mmol/L   Glucose, Bld 116 (H) 70 - 99 mg/dL    Comment: Glucose reference range applies only to samples taken after fasting for at least 8 hours.   BUN 16 8 - 23 mg/dL   Creatinine, Ser 0.59 0.44 - 1.00 mg/dL   Calcium 8.7 (L) 8.9 - 10.3 mg/dL   Total Protein 6.7 6.5 - 8.1 g/dL   Albumin 3.7 3.5 - 5.0 g/dL   AST 262 (H) 15 - 41 U/L   ALT 536 (H) 0 - 44 U/L   Alkaline Phosphatase 103 38 - 126 U/L   Total Bilirubin 0.8 0.3 - 1.2 mg/dL   GFR, Estimated >60 >60 mL/min    Comment: (NOTE) Calculated using the CKD-EPI Creatinine Equation (2021)    Anion gap 8 5 - 15    Comment: Performed at [facility], [place] [address] [place], [place] [address]   CT ABDOMEN PELVIS W CONTRAST  Result Date: 06/29/2021 CLINICAL DATA:  Left upper quadrant abdominal pain EXAM: CT ABDOMEN AND PELVIS WITH CONTRAST TECHNIQUE: Multidetector CT imaging of the abdomen and pelvis was performed using the standard protocol following bolus administration of intravenous contrast. RADIATION DOSE REDUCTION: This exam was performed according to the departmental dose-optimization program which includes automated exposure control, adjustment of the mA and/or  kV according to patient size and/or use of iterative reconstruction technique. CONTRAST:  47mL OMNIPAQUE IOHEXOL 300 MG/ML  SOLN COMPARISON:  None Available. FINDINGS: Lower chest: Visualized lung bases demonstrate discoid atelectasis. Visualized heart and pericardium are unremarkable. Hepatobiliary: Moderate hepatic steatosis. No enhancing intrahepatic mass. No intra or extrahepatic biliary ductal dilation. Gallbladder unremarkable. Pancreas: Mild peripancreatic acute inflammatory fluid is identified surrounding the head and body of the pancreas in keeping with changes of acute edematous/interstitial pancreatitis. Normal enhancement of the pancreatic parenchyma. No pancreatic or peripancreatic necrosis. The pancreatic duct is not  dilated. No loculated peripancreatic fluid collections are identified. Spleen: Unremarkable Adrenals/Urinary Tract: Adrenal glands are unremarkable. Kidneys are normal, without renal calculi, focal lesion, or hydronephrosis. Bladder is unremarkable. Stomach/Bowel: Moderate descending and sigmoid colonic diverticulosis. No superimposed acute inflammatory change. The stomach, small bowel, and large bowel are otherwise unremarkable. Appendix absent. No free intraperitoneal gas or fluid. Vascular/Lymphatic: Minimal atherosclerotic calcification within the abdominal aorta. No aortic aneurysm. Retroaortic left renal vein. Mild shotty peripancreatic and periportal adenopathy is likely reactive in nature. No frankly pathologic adenopathy within the abdomen and pelvis. Reproductive: Uterus and bilateral adnexa are unremarkable. Other: Small fat containing bilateral inguinal hernias. Musculoskeletal: No acute bone abnormality. No lytic or blastic bone lesion. IMPRESSION: Mild acute interstitial/edematous pancreatitis. No pancreatic or peripancreatic necrosis. No loculated peripancreatic fluid collections. Associated shotty probable reactive peripancreatic and periportal adenopathy. Moderate hepatic steatosis. Moderate distal colonic diverticulosis without superimposed acute inflammatory change. Aortic Atherosclerosis (ICD10-I70.0). Electronically Signed   By: Fidela Salisbury M.D.   On: 06/29/2021 23:44   MR ABDOMEN MRCP WO CONTRAST  Result Date: 06/30/2021 CLINICAL DATA:  Suspected pancreatitis. Cholelithiasis. Biliary dilatation. EXAM: MRI ABDOMEN WITHOUT CONTRAST  (INCLUDING MRCP) TECHNIQUE: Multiplanar multisequence MR imaging of the abdomen was performed. Heavily T2-weighted images of the biliary and pancreatic ducts were obtained, and three-dimensional MRCP images were rendered by post processing. COMPARISON:  CT abdomen 04/29/2021 FINDINGS: Lower chest: Bandlike atelectasis or scarring in both lower lobes.  Hepatobiliary: Innumerable small gallstones are present in the gallbladder. Common bile duct 0.6 cm in diameter, upper normal size for age. No filling defect is observed in the common bile duct. No significant hepatic parenchymal lesion is identified on noncontrast exam. Diffuse hepatic steatosis noted. Pancreas: Accentuated T2 signal in the pancreas is most striking in the pancreatic body for example on image 25 series 3. There is only subtle accentuation of diffusion restriction in this area. Otherwise there is mild peripancreatic edema. Partial pancreas divisum with a small narrowed segment of the dorsal pancreatic duct at the branch point to the minor papilla for example on image 51 series 8. No pseudocyst or abscess identified. Spleen:  Unremarkable Adrenals/Urinary Tract:  Unremarkable Stomach/Bowel: Unremarkable Vascular/Lymphatic: Likely reactive lymph nodes in the porta hepatis include a 1.2 cm portacaval node on image 22 series 3 and a 0.9 cm peripancreatic lymph node on image 22 series 3. Other:  No supplemental non-categorized findings. Musculoskeletal: Unremarkable IMPRESSION: 1. Edema signal in the pancreatic body parenchyma, along with peripancreatic edema, compatible with acute focal pancreatitis. I am skeptical of pancreatic malignancy but given the focal nature of the findings in the pancreatic body, surveillance imaging to ensure complete resolution may be warranted. No pancreatic abscess or pseudocyst currently identified. 2. Numerous tiny gallstones in the gallbladder without findings of choledocholithiasis. The common bile duct is currently 0.6 cm in diameter which is in the upper normal range. 3. Diffuse hepatic steatosis. Electronically Signed   By: Cindra Eves.D.  On: 06/30/2021 09:03   MR 3D Recon At Scanner  Result Date: 06/30/2021 CLINICAL DATA:  Suspected pancreatitis. Cholelithiasis. Biliary dilatation. EXAM: MRI ABDOMEN WITHOUT CONTRAST  (INCLUDING MRCP) TECHNIQUE:  Multiplanar multisequence MR imaging of the abdomen was performed. Heavily T2-weighted images of the biliary and pancreatic ducts were obtained, and three-dimensional MRCP images were rendered by post processing. COMPARISON:  CT abdomen 04/29/2021 FINDINGS: Lower chest: Bandlike atelectasis or scarring in both lower lobes. Hepatobiliary: Innumerable small gallstones are present in the gallbladder. Common bile duct 0.6 cm in diameter, upper normal size for age. No filling defect is observed in the common bile duct. No significant hepatic parenchymal lesion is identified on noncontrast exam. Diffuse hepatic steatosis noted. Pancreas: Accentuated T2 signal in the pancreas is most striking in the pancreatic body for example on image 25 series 3. There is only subtle accentuation of diffusion restriction in this area. Otherwise there is mild peripancreatic edema. Partial pancreas divisum with a small narrowed segment of the dorsal pancreatic duct at the branch point to the minor papilla for example on image 51 series 8. No pseudocyst or abscess identified. Spleen:  Unremarkable Adrenals/Urinary Tract:  Unremarkable Stomach/Bowel: Unremarkable Vascular/Lymphatic: Likely reactive lymph nodes in the porta hepatis include a 1.2 cm portacaval node on image 22 series 3 and a 0.9 cm peripancreatic lymph node on image 22 series 3. Other:  No supplemental non-categorized findings. Musculoskeletal: Unremarkable IMPRESSION: 1. Edema signal in the pancreatic body parenchyma, along with peripancreatic edema, compatible with acute focal pancreatitis. I am skeptical of pancreatic malignancy but given the focal nature of the findings in the pancreatic body, surveillance imaging to ensure complete resolution may be warranted. No pancreatic abscess or pseudocyst currently identified. 2. Numerous tiny gallstones in the gallbladder without findings of choledocholithiasis. The common bile duct is currently 0.6 cm in diameter which is in the  upper normal range. 3. Diffuse hepatic steatosis. Electronically Signed   By: Van Clines M.D.   On: 06/30/2021 09:03   US Abdomen Limited RUQ (LIVER/GB)  Result Date: 06/30/2021 CLINICAL DATA:  Acute pancreatitis EXAM: ULTRASOUND ABDOMEN LIMITED RIGHT UPPER QUADRANT COMPARISON:  CT 05/29/2016 FINDINGS: Gallbladder: Multiple layering gallstones are seen within the gallbladder. The gallbladder, however, is not distended, there is no gallbladder wall thickening, and no pericholecystic fluid is identified. The sonographic Percell Miller sign is reportedly negative. Common bile duct: Diameter: Dilated measuring 8 mm in proximal diameter. This appears new since prior CT examination. Liver: Hepatic parenchymal echogenicity is diffusely increased and there is diffuse coarsening of the hepatic echotexture in keeping with mild hepatic steatosis. No focal intrahepatic masses are seen and there is no intrahepatic biliary ductal dilation. Portal vein is patent on color Doppler imaging with normal direction of blood flow towards the liver. Other: None. IMPRESSION: Cholelithiasis without sonographic evidence of acute cholecystitis. Dilation of the extrahepatic bile duct. A distal obstructing lesion is not excluded. ERCP or MRCP examination may be more helpful for further evaluation. Mild hepatic steatosis. Electronically Signed   By: Fidela Salisbury M.D.   On: 06/30/2021 04:02      Assessment/Plan Biliary pancreatitis - CT 5/25: mild acute pancreatitis without necrosis or fluid collections - RUQ [place] today: cholelithiasis without evidence of cholecystitis - MRCP today: pancreatic edema, cholelithiasis, no choledocholithiasis - lipase 374 on admit, not checked today - TBili has been normal, AST/ALT 262/536 from 362/630, Alk Phos has normalized  - patient is still fairly tender in her epigastrium.  Hopefully she will be ready for OR  tomorrow, but pending her exam, may not be ready until Sunday.  Discussed with her the  reasoning behind this.  She and her family understand -recommend proceeding to OR for laparoscopic cholecystectomy when better from a pancreatitis standpoint.   FEN: CLD/NPO p MN, IVF $Remov'@200'NTozWj$  cc/h VTE: LMWH ID: [email or web address] - does not need abx from a surgical standpoint.  WBC yesterday assuredly from inflammation of her pancreas and not infection, but may be continued pending UA/urine culture  Aortic atherosclerosis  Mitral valve prolapse   I have seen and examined the patient, labs, vitals, imaging have been reviewed for last 48 hours.  I have reviewed the hospitalist and GI notes and discussed her with the GI attending.  Henreitta Cea , [facility] 06/30/2021, 10:59 AM Please see Amion for pager number during day hours 7:00am-4:30pm

## 2021-06-30 NOTE — Progress Notes (Signed)
  Progress Note   Patient: Tiffany Cunningham AJG:[id number] DOB: [birth year] DOA: 06/29/2021     0 DOS: the patient was seen and examined on 06/30/2021   Brief hospital course: 66 year old woman presenting with epigastric abdominal pain.  Admitted for acute pancreatitis. Further evaluation revealed cholelithiasis without evidence of acute cholecystitis, MRCP showed pancreatitis without acute complicating features.  No choledocholithiasis.  General surgery consulted with plans for tentative laparoscopic cholecystectomy this hospitalization.  Assessment and Plan: * Acute pancreatitis -- Most likely gallstone pancreatitis, numerous gallstones seen on ultrasound.  MRCP negative for choledocholithiasis. No alcohol. TG WNL. FHx autoimmune pancreatitis -- GI evaluating. -- Much better today symptomatically.  AST and ALT trending down, lipase not repeated. Continue analgesia as needed, IV fluids. -- Repeat labs in AM. --stop abx --per MRCP: radiologist "skeptical of pancreatic malignancy but given the focal nature of the findings in the pancreatic body, surveillance imaging to ensure complete resolution may be warranted.    Essential hypertension --Continue home norvasc  Aortic atherosclerosis (HCC) --can consider statin as outpatient        Subjective:  Feels better today No pain currently  Physical Exam: Vitals:   06/30/21 0701 06/30/21 0912 06/30/21 1242 06/30/21 1420  BP: 127/65 131/63 138/62 124/62  Pulse: 98 98 (!) 110 99  Resp: 16  19 19   Temp: 98.4 F (36.9 C)  (!) 100.9 F (38.3 C) 99.6 F (37.6 C)  TempSrc: Oral   Oral  SpO2: 90%  94% 93%  Weight:      Height:       Physical Exam Vitals reviewed.  Constitutional:      General: She is not in acute distress.    Appearance: She is not ill-appearing or toxic-appearing.  Cardiovascular:     Rate and Rhythm: Normal rate and regular rhythm.     Heart sounds: No murmur heard. Pulmonary:     Effort: Pulmonary effort is  normal. No respiratory distress.     Breath sounds: No wheezing, rhonchi or rales.  Neurological:     Mental Status: She is alert.  Psychiatric:        Mood and Affect: Mood normal.        Behavior: Behavior normal.    Data Reviewed:  K+ 3.4 AST down to 262 ALT down to 536 WBC down to 7.9 MRCP noted  Family Communication: none  Disposition: Status is: Inpatient Remains inpatient appropriate because: acute gallstone pancreatitis  Planned Discharge Destination: Home    Time spent: 25 minutes  Author: , MD 06/30/2021 4:35 PM  For on call review [email or web address].

## 2021-06-30 NOTE — Assessment & Plan Note (Addendum)
--   Most likely gallstone pancreatitis, numerous gallstones seen on ultrasound.  MRCP negative for choledocholithiasis. No alcohol. TG WNL. FHx autoimmune pancreatitis -- GI evaluating. -- Much better today symptomatically.  AST and ALT trending down, lipase not repeated. Continue analgesia as needed, IV fluids. -- Repeat labs in AM. --stop abx --per MRCP: radiologist "skeptical of pancreatic malignancy but given the focal nature of the findings in the pancreatic body, surveillance imaging to ensure complete resolution may be warranted.

## 2021-06-30 NOTE — TOC Initial Note (Signed)
Transition of Care [facility]) - Initial/Assessment Note    Patient Details  Name: Tiffany Cunningham MRN: [id number] Date of Birth: Aug 07, 1955  Transition of Care [facility]) CM/SW Contact:    Golda Acre, RN Phone Number: 06/30/2021, 7:37 AM  Clinical Narrative:                  Transition of Care [facility]) Screening Note   Patient Details  Name: Tiffany Cunningham Date of Birth: [birth year]   Transition of Care [facility]) CM/SW Contact:    Golda Acre, RN Phone Number: 06/30/2021, 7:37 AM    Transition of Care Department [facility]) has reviewed patient and no TOC needs have been identified at this time. We will continue to monitor patient advancement through interdisciplinary progression rounds. If new patient transition needs arise, please place a TOC consult.    Expected Discharge Plan: Home/Self Care Barriers to Discharge: No Barriers Identified   Patient Goals and CMS Choice Patient states their goals for this hospitalization and ongoing recovery are:: to go back home CMS Medicare.gov Compare Post Acute Care list provided to:: Patient    Expected Discharge Plan and Services Expected Discharge Plan: Home/Self Care   Discharge Planning Services: CM Consult   Living arrangements for the past 2 months: Single Family Home                                      Prior Living Arrangements/Services Living arrangements for the past 2 months: Single Family Home Lives with:: Spouse Patient language and need for interpreter reviewed:: Yes Do you feel safe going back to the place where you live?: Yes            Criminal Activity/Legal Involvement Pertinent to Current Situation/Hospitalization: No - Comment as needed  Activities of Daily Living Home Assistive Devices/Equipment: None ADL Screening (condition at time of admission) Patient's cognitive ability adequate to safely complete daily activities?: Yes Is the patient deaf or have difficulty hearing?: No Does the patient  have difficulty seeing, even when wearing glasses/contacts?: No Does the patient have difficulty concentrating, remembering, or making decisions?: No Patient able to express need for assistance with ADLs?: No Does the patient have difficulty dressing or bathing?: No Independently performs ADLs?: Yes (appropriate for developmental age) Does the patient have difficulty walking or climbing stairs?: No Weakness of Legs: None Weakness of Arms/Hands: None  Permission Sought/Granted                  Emotional Assessment Appearance:: Appears stated age     Orientation: : Oriented to Self, Oriented to Place, Oriented to  Time, Oriented to Situation Alcohol / Substance Use: Never Used Psych Involvement: No (comment)  Admission diagnosis:  Acute pancreatitis [K85.90] Acute pancreatitis without infection or necrosis, unspecified pancreatitis type [K85.90] Patient Active Problem List   Diagnosis Date Noted   Acute pancreatitis 06/30/2021   Pyuria 06/30/2021   Mitral valve prolapse 03/22/2015   Mitral regurgitation 03/22/2015   Palpitations 03/22/2015   Essential hypertension 03/22/2015   Seasonal and perennial allergic rhinoconjunctivitis 02/23/2015   PCP:  Ladora Daniel, PA-C Pharmacy:   [facility] [id number] - [address], [place] - [address] [facility] [address] [place] [address] Phone: [phone] Fax: [phone]  [id number] - [place], [place] - [address] AT [facility] [address] [place] [address] Phone: [phone] Fax:  [phone]     Social Determinants of Health (SDOH) Interventions    Readmission Risk Interventions     View : No data to display.

## 2021-06-30 NOTE — Plan of Care (Signed)

## 2021-06-30 NOTE — Progress Notes (Signed)
Pt arrived to unit in stable condition via transport. Pt A/Ox4, RA, pain currently controlled. Pt ambulated independently to bathroom with no issues. Pt placement/triad notified of pt arrival. Pt oriented to room. Skin assessment complete.

## 2021-06-30 NOTE — Assessment & Plan Note (Addendum)
--  Continue home norvasc

## 2021-07-01 ENCOUNTER — Inpatient Hospital Stay (HOSPITAL_COMMUNITY): Payer: No Typology Code available for payment source | Admitting: Anesthesiology

## 2021-07-01 ENCOUNTER — Encounter (HOSPITAL_COMMUNITY)
Admission: EM | Disposition: A | Discharge status: Home / Self Care | Payer: Self-pay | Source: Home / Self Care | Attending: Family Medicine

## 2021-07-01 ENCOUNTER — Other Ambulatory Visit: Payer: Self-pay

## 2021-07-01 ENCOUNTER — Inpatient Hospital Stay (HOSPITAL_COMMUNITY): Payer: No Typology Code available for payment source

## 2021-07-01 DIAGNOSIS — I10 Essential (primary) hypertension: Secondary | ICD-10-CM | POA: Diagnosis not present

## 2021-07-01 DIAGNOSIS — K851 Biliary acute pancreatitis without necrosis or infection: Secondary | ICD-10-CM

## 2021-07-01 HISTORY — PX: CHOLECYSTECTOMY: SHX55

## 2021-07-01 LAB — COMPREHENSIVE METABOLIC PANEL
ALT: 286 U/L — ABNORMAL HIGH (ref 0–44)
AST: 86 U/L — ABNORMAL HIGH (ref 15–41)
Albumin: 3 g/dL — ABNORMAL LOW (ref 3.5–5.0)
Alkaline Phosphatase: 82 U/L (ref 38–126)
Anion gap: 8 (ref 5–15)
BUN: 10 mg/dL (ref 8–23)
CO2: 27 mmol/L (ref 22–32)
Calcium: 8.2 mg/dL — ABNORMAL LOW (ref 8.9–10.3)
Chloride: 103 mmol/L (ref 98–111)
Creatinine, Ser: 0.49 mg/dL (ref 0.44–1.00)
GFR, Estimated: >60 mL/min (ref >60)
Glucose, Bld: 101 mg/dL — ABNORMAL HIGH (ref 70–99)
Potassium: 3.2 mmol/L — ABNORMAL LOW (ref 3.5–5.1)
Sodium: 138 mmol/L (ref 135–145)
Total Bilirubin: 1 mg/dL (ref 0.3–1.2)
Total Protein: 5.8 g/dL — ABNORMAL LOW (ref 6.5–8.1)

## 2021-07-01 LAB — URINE CULTURE: Culture: NO GROWTH

## 2021-07-01 LAB — IGG 4: IgG, Subclass 4: 17 mg/dL (ref 2–96)

## 2021-07-01 LAB — CBC WITH DIFFERENTIAL/PLATELET
Abs Immature Granulocytes: 0.05 10*3/uL (ref 0.00–0.07)
Basophils Absolute: 0 10*3/uL (ref 0.0–0.1)
Basophils Relative: 0 %
Eosinophils Absolute: 0 10*3/uL (ref 0.0–0.5)
Eosinophils Relative: 0 %
HCT: 31.9 % — ABNORMAL LOW (ref 36.0–46.0)
Hemoglobin: 10.7 g/dL — ABNORMAL LOW (ref 12.0–15.0)
Immature Granulocytes: 1 %
Lymphocytes Relative: 15 %
Lymphs Abs: 1.4 10*3/uL (ref 0.7–4.0)
MCH: 30.6 pg (ref 26.0–34.0)
MCHC: 33.5 g/dL (ref 30.0–36.0)
MCV: 91.1 fL (ref 80.0–100.0)
Monocytes Absolute: 0.9 10*3/uL (ref 0.1–1.0)
Monocytes Relative: 10 %
Neutro Abs: 6.7 10*3/uL (ref 1.7–7.7)
Neutrophils Relative %: 74 %
Platelets: 209 10*3/uL (ref 150–400)
RBC: 3.5 MIL/uL — ABNORMAL LOW (ref 3.87–5.11)
RDW: 12.3 % (ref 11.5–15.5)
WBC: 9.1 10*3/uL (ref 4.0–10.5)
nRBC: 0 % (ref 0.0–0.2)

## 2021-07-01 LAB — SURGICAL PCR SCREEN
MRSA, PCR: NEGATIVE
Staphylococcus aureus: NEGATIVE

## 2021-07-01 LAB — ANA: Anti Nuclear Antibody (ANA): POSITIVE — AB

## 2021-07-01 LAB — LIPASE, BLOOD: Lipase: 34 U/L (ref 11–51)

## 2021-07-01 SURGERY — LAPAROSCOPIC CHOLECYSTECTOMY WITH INTRAOPERATIVE CHOLANGIOGRAM
Anesthesia: General | Site: Abdomen

## 2021-07-01 MED ORDER — ROCURONIUM BROMIDE 10 MG/ML (PF) SYRINGE
PREFILLED_SYRINGE | INTRAVENOUS | Status: AC
  Filled 2021-07-01: qty 10

## 2021-07-01 MED ORDER — ACETAMINOPHEN 500 MG PO TABS
ORAL_TABLET | ORAL | Status: AC
  Filled 2021-07-01: qty 2

## 2021-07-01 MED ORDER — SUCCINYLCHOLINE CHLORIDE 200 MG/10ML IV SOSY
PREFILLED_SYRINGE | INTRAVENOUS | Status: AC
  Filled 2021-07-01: qty 10

## 2021-07-01 MED ORDER — POTASSIUM CHLORIDE 10 MEQ/100ML IV SOLN
10.0000 meq | INTRAVENOUS | Status: AC
  Administered 2021-07-01: 10 meq via INTRAVENOUS
  Filled 2021-07-01: qty 100

## 2021-07-01 MED ORDER — SUGAMMADEX SODIUM 200 MG/2ML IV SOLN
INTRAVENOUS | Status: DC | PRN
  Administered 2021-07-01: 200 mg via INTRAVENOUS

## 2021-07-01 MED ORDER — BUPIVACAINE-EPINEPHRINE (PF) 0.25% -1:200000 IJ SOLN
INTRAMUSCULAR | Status: DC | PRN
  Administered 2021-07-01: 12 mL

## 2021-07-01 MED ORDER — BUPIVACAINE-EPINEPHRINE (PF) 0.25% -1:200000 IJ SOLN
INTRAMUSCULAR | Status: AC
  Filled 2021-07-01: qty 30

## 2021-07-01 MED ORDER — LIDOCAINE HCL (PF) 2 % IJ SOLN
INTRAMUSCULAR | Status: AC
  Filled 2021-07-01: qty 5

## 2021-07-01 MED ORDER — DEXMEDETOMIDINE (PRECEDEX) IN NS 20 MCG/5ML (4 MCG/ML) IV SYRINGE
PREFILLED_SYRINGE | INTRAVENOUS | Status: DC | PRN
  Administered 2021-07-01: 8 ug via INTRAVENOUS
  Administered 2021-07-01: 4 ug via INTRAVENOUS

## 2021-07-01 MED ORDER — OXYCODONE HCL 5 MG PO TABS
5.0000 mg | ORAL_TABLET | ORAL | Status: DC | PRN
  Administered 2021-07-01 – 2021-07-02 (×2): 10 mg via ORAL
  Filled 2021-07-01 (×2): qty 2

## 2021-07-01 MED ORDER — OXYCODONE HCL 5 MG PO TABS: 5.0000 mg | ORAL_TABLET | Freq: Once | ORAL | Status: DC | PRN

## 2021-07-01 MED ORDER — PHENYLEPHRINE HCL-NACL 20-0.9 MG/250ML-% IV SOLN
INTRAVENOUS | Status: DC | PRN
  Administered 2021-07-01: 40 ug/min via INTRAVENOUS

## 2021-07-01 MED ORDER — PROPOFOL 10 MG/ML IV BOLUS
INTRAVENOUS | Status: DC | PRN
Start: 2021-07-01 — End: 2021-07-01
  Administered 2021-07-01: 150 mg via INTRAVENOUS

## 2021-07-01 MED ORDER — HYDROMORPHONE HCL 1 MG/ML IJ SOLN
0.2500 mg | INTRAMUSCULAR | Status: DC | PRN
  Administered 2021-07-01: 0.25 mg via INTRAVENOUS

## 2021-07-01 MED ORDER — DEXTROSE 5 % IV SOLN
INTRAVENOUS | Status: DC | PRN
  Administered 2021-07-01: 2 g via INTRAVENOUS

## 2021-07-01 MED ORDER — PHENYLEPHRINE 80 MCG/ML (10ML) SYRINGE FOR IV PUSH (FOR BLOOD PRESSURE SUPPORT)
PREFILLED_SYRINGE | INTRAVENOUS | Status: AC
  Filled 2021-07-01: qty 10

## 2021-07-01 MED ORDER — LACTATED RINGERS IV SOLN: INTRAVENOUS | Status: DC | PRN

## 2021-07-01 MED ORDER — LIDOCAINE 2% (20 MG/ML) 5 ML SYRINGE
INTRAMUSCULAR | Status: DC | PRN
  Administered 2021-07-01: 60 mg via INTRAVENOUS

## 2021-07-01 MED ORDER — SODIUM CHLORIDE 0.9 % IV SOLN
INTRAVENOUS | Status: AC
  Filled 2021-07-01: qty 20

## 2021-07-01 MED ORDER — OXYCODONE HCL 5 MG/5ML PO SOLN: 5.0000 mg | Freq: Once | ORAL | Status: DC | PRN

## 2021-07-01 MED ORDER — FENTANYL CITRATE (PF) 250 MCG/5ML IJ SOLN
INTRAMUSCULAR | Status: AC
  Filled 2021-07-01: qty 5

## 2021-07-01 MED ORDER — SUCCINYLCHOLINE CHLORIDE 200 MG/10ML IV SOSY
PREFILLED_SYRINGE | INTRAVENOUS | Status: DC | PRN
  Administered 2021-07-01: 120 mg via INTRAVENOUS

## 2021-07-01 MED ORDER — LACTATED RINGERS IR SOLN
Status: DC | PRN
  Administered 2021-07-01: 1

## 2021-07-01 MED ORDER — ACETAMINOPHEN 10 MG/ML IV SOLN
INTRAVENOUS | Status: DC | PRN
  Administered 2021-07-01: 1000 mg via INTRAVENOUS

## 2021-07-01 MED ORDER — DEXAMETHASONE SODIUM PHOSPHATE 10 MG/ML IJ SOLN
INTRAMUSCULAR | Status: DC | PRN
  Administered 2021-07-01: 8 mg via INTRAVENOUS

## 2021-07-01 MED ORDER — DEXMEDETOMIDINE (PRECEDEX) IN NS 20 MCG/5ML (4 MCG/ML) IV SYRINGE
PREFILLED_SYRINGE | INTRAVENOUS | Status: AC
  Filled 2021-07-01: qty 5

## 2021-07-01 MED ORDER — ONDANSETRON HCL 4 MG/2ML IJ SOLN
INTRAMUSCULAR | Status: DC | PRN
  Administered 2021-07-01: 4 mg via INTRAVENOUS

## 2021-07-01 MED ORDER — HYDROMORPHONE HCL 1 MG/ML IJ SOLN
INTRAMUSCULAR | Status: AC
  Administered 2021-07-01: 0.5 mg via INTRAVENOUS
  Filled 2021-07-01: qty 1

## 2021-07-01 MED ORDER — LIDOCAINE HCL (PF) 1 % IJ SOLN
INTRAMUSCULAR | Status: DC | PRN
  Administered 2021-07-01: 12 mL

## 2021-07-01 MED ORDER — AMISULPRIDE (ANTIEMETIC) 5 MG/2ML IV SOLN: 10.0000 mg | Freq: Once | INTRAVENOUS | Status: DC | PRN

## 2021-07-01 MED ORDER — IOPAMIDOL (ISOVUE-300) INJECTION 61%
INTRAVENOUS | Status: DC | PRN
  Administered 2021-07-01: 7 mL

## 2021-07-01 MED ORDER — ACETAMINOPHEN 10 MG/ML IV SOLN
INTRAVENOUS | Status: AC
  Filled 2021-07-01: qty 100

## 2021-07-01 MED ORDER — POTASSIUM CHLORIDE 10 MEQ/100ML IV SOLN
10.0000 meq | INTRAVENOUS | Status: AC
  Administered 2021-07-01 (×3): 10 meq via INTRAVENOUS
  Filled 2021-07-01 (×3): qty 100

## 2021-07-01 MED ORDER — MIDAZOLAM HCL 5 MG/5ML IJ SOLN
INTRAMUSCULAR | Status: DC | PRN
  Administered 2021-07-01: 2 mg via INTRAVENOUS

## 2021-07-01 MED ORDER — FENTANYL CITRATE (PF) 100 MCG/2ML IJ SOLN
INTRAMUSCULAR | Status: DC | PRN
  Administered 2021-07-01: 100 ug via INTRAVENOUS
  Administered 2021-07-01: 50 ug via INTRAVENOUS

## 2021-07-01 MED ORDER — BUPIVACAINE LIPOSOME 1.3 % IJ SUSP
INTRAMUSCULAR | Status: AC
  Filled 2021-07-01: qty 20

## 2021-07-01 MED ORDER — ROCURONIUM BROMIDE 10 MG/ML (PF) SYRINGE
PREFILLED_SYRINGE | INTRAVENOUS | Status: DC | PRN
Start: 2021-07-01 — End: 2021-07-01
  Administered 2021-07-01: 50 mg via INTRAVENOUS

## 2021-07-01 MED ORDER — PROPOFOL 10 MG/ML IV BOLUS
INTRAVENOUS | Status: AC
Start: 2021-07-01 — End: ?
  Filled 2021-07-01: qty 20

## 2021-07-01 MED ORDER — LIDOCAINE HCL (PF) 1 % IJ SOLN
INTRAMUSCULAR | Status: AC
  Filled 2021-07-01: qty 30

## 2021-07-01 MED ORDER — DEXAMETHASONE SODIUM PHOSPHATE 10 MG/ML IJ SOLN
INTRAMUSCULAR | Status: AC
  Filled 2021-07-01: qty 1

## 2021-07-01 MED ORDER — 0.9 % SODIUM CHLORIDE (POUR BTL) OPTIME
TOPICAL | Status: DC | PRN
  Administered 2021-07-01: 1000 mL

## 2021-07-01 MED ORDER — ONDANSETRON HCL 4 MG/2ML IJ SOLN: 4.0000 mg | Freq: Once | INTRAMUSCULAR | Status: DC | PRN

## 2021-07-01 MED ORDER — PHENYLEPHRINE 80 MCG/ML (10ML) SYRINGE FOR IV PUSH (FOR BLOOD PRESSURE SUPPORT)
PREFILLED_SYRINGE | INTRAVENOUS | Status: DC | PRN
  Administered 2021-07-01 (×2): 80 ug via INTRAVENOUS

## 2021-07-01 MED ORDER — MIDAZOLAM HCL 2 MG/2ML IJ SOLN
INTRAMUSCULAR | Status: AC
  Filled 2021-07-01: qty 2

## 2021-07-01 MED ORDER — ONDANSETRON HCL 4 MG/2ML IJ SOLN
INTRAMUSCULAR | Status: AC
  Filled 2021-07-01: qty 2

## 2021-07-01 SURGICAL SUPPLY — 46 items
APPLIER CLIP ROT 10 11.4 M/L (STAPLE) ×2
BAG COUNTER SPONGE SURGICOUNT (BAG) IMPLANT
BAG RETRIEVAL 10 (BASKET) ×1
CABLE HIGH FREQUENCY MONO STRZ (ELECTRODE) IMPLANT
CHLORAPREP W/TINT 26 (MISCELLANEOUS) ×2 IMPLANT
CLIP APPLIE ROT 10 11.4 M/L (STAPLE) ×1 IMPLANT
CLIP LIGATING HEMO O LOK GREEN (MISCELLANEOUS) IMPLANT
COVER MAYO STAND XLG (MISCELLANEOUS) ×2 IMPLANT
COVER SURGICAL LIGHT HANDLE (MISCELLANEOUS) ×2 IMPLANT
DERMABOND ADVANCED (GAUZE/BANDAGES/DRESSINGS) ×1
DERMABOND ADVANCED .7 DNX12 (GAUZE/BANDAGES/DRESSINGS) IMPLANT
DRAPE C-ARM 42X120 X-RAY (DRAPES) IMPLANT
ELECT L-HOOK LAP 45CM DISP (ELECTROSURGICAL)
ELECT REM PT RETURN 15FT ADLT (MISCELLANEOUS) ×2 IMPLANT
ELECTRODE L-HOOK LAP 45CM DISP (ELECTROSURGICAL) IMPLANT
GLOVE BIO SURGEON STRL SZ 6 (GLOVE) ×2 IMPLANT
GLOVE INDICATOR 6.5 STRL GRN (GLOVE) ×2 IMPLANT
GOWN SPEC L3 XXLG W/TWL (GOWN DISPOSABLE) ×2 IMPLANT
GOWN STRL REUS W/TWL 2XL LVL3 (GOWN DISPOSABLE) ×2 IMPLANT
HEMOSTAT SNOW SURGICEL 2X4 (HEMOSTASIS) IMPLANT
IRRIG SUCT STRYKERFLOW 2 WTIP (MISCELLANEOUS) ×2
IRRIGATION SUCT STRKRFLW 2 WTP (MISCELLANEOUS) ×1 IMPLANT
KIT BASIN OR (CUSTOM PROCEDURE TRAY) ×2 IMPLANT
KIT TURNOVER KIT A (KITS) IMPLANT
L-HOOK LAP DISP 36CM (ELECTROSURGICAL)
LHOOK LAP DISP 36CM (ELECTROSURGICAL) IMPLANT
PENCIL SMOKE EVACUATOR (MISCELLANEOUS) IMPLANT
POUCH RETRIEVAL ECOSAC 10 (ENDOMECHANICALS) ×1 IMPLANT
POUCH RETRIEVAL ECOSAC 10MM (ENDOMECHANICALS) ×1
PROTECTOR NERVE ULNAR (MISCELLANEOUS) IMPLANT
SCISSORS LAP 5X35 DISP (ENDOMECHANICALS) ×2 IMPLANT
SET CHOLANGIOGRAPH MIX (MISCELLANEOUS) IMPLANT
SET TUBE SMOKE EVAC HIGH FLOW (TUBING) IMPLANT
SLEEVE Z-THREAD 5X100MM (TROCAR) ×3 IMPLANT
SPIKE FLUID TRANSFER (MISCELLANEOUS) ×2 IMPLANT
SUT MNCRL AB 4-0 PS2 18 (SUTURE) ×2 IMPLANT
SYS BAG RETRIEVAL 10MM (BASKET) ×1
SYSTEM BAG RETRIEVAL 10MM (BASKET) ×1 IMPLANT
TAPE CLOTH 4X10 WHT NS (GAUZE/BANDAGES/DRESSINGS) IMPLANT
TOWEL OR 17X26 10 PK STRL BLUE (TOWEL DISPOSABLE) ×2 IMPLANT
TOWEL OR NON WOVEN STRL DISP B (DISPOSABLE) ×2 IMPLANT
TRAY LAPAROSCOPIC (CUSTOM PROCEDURE TRAY) ×2 IMPLANT
TROCAR BALLN 12MMX100 BLUNT (TROCAR) ×3 IMPLANT
TROCAR XCEL NON-BLD 11X100MML (ENDOMECHANICALS) ×2 IMPLANT
TROCAR Z-THREAD FIOS 11X100 BL (TROCAR) ×1 IMPLANT
TROCAR Z-THREAD OPTICAL 5X100M (TROCAR) ×2 IMPLANT

## 2021-07-01 NOTE — Transfer of Care (Signed)
Immediate Anesthesia Transfer of Care Note  Patient: Tiffany Cunningham  Procedure(s) Performed: LAPAROSCOPIC CHOLECYSTECTOMY WITH INTRAOPERATIVE CHOLANGIOGRAM (Abdomen)  Patient Location: PACU  Anesthesia Type:General  Level of Consciousness: drowsy and patient cooperative  Airway & Oxygen Therapy: Patient Spontanous Breathing and Patient connected to face mask oxygen  Post-op Assessment: Report given to RN and Post -op Vital signs reviewed and stable  Post vital signs: Reviewed and stable  Last Vitals:  Vitals Value Taken Time  BP 136/69 07/01/21 1217  Temp    Pulse 88 07/01/21 1218  Resp 15 07/01/21 1218  SpO2 100 % 07/01/21 1218  Vitals shown include unvalidated device data.  Last Pain:  Vitals:   07/01/21 0745  TempSrc:   PainSc: 2       Patients Stated Pain Goal: 0 (06/30/21 1242)  Complications: No notable events documented.

## 2021-07-01 NOTE — Op Note (Signed)
Laparoscopic Cholecystectomy with IOC   Indications: This patient presents with gallstone pancreatitis and will undergo laparoscopic cholecystectomy.  Pre-operative Diagnosis: gallstone pancreatitis  Post-operative Diagnosis: Same  Surgeon: Almond Lint   Assistants: n/a  Anesthesia: General endotracheal anesthesia and local  ASA Class: 2E  Procedure Details   The patient was seen again in the Holding Room. The risks, benefits, complications, treatment options, and expected outcomes were discussed with the patient. The possibilities of  bleeding, recurrent infection, damage to nearby structures, the need for additional procedures, failure to diagnose a condition, the possible need to convert to an open procedure, and creating a complication requiring transfusion or operation were discussed with the patient. The likelihood of improving the patient's symptoms with return to their baseline status is good.    The patient and/or family concurred with the proposed plan, giving informed consent. The site of surgery properly noted. The patient was taken to OR # 1 and placed supine on the OR table.  SCDs were placed. Antibiotic prophylaxis was administered. General endotracheal anesthesia was then administered and tolerated well. After the induction, the abdomen was prepped with Chloraprep and draped in the sterile fashion. and the procedure verified as Laparoscopic Cholecystectomy with possible Intraoperative Cholangiogram. A Time Out was held and the above information confirmed.  Local anesthetic agent was injected into the skin near the umbilicus and a transverse infraumbilical incision was made with a #11 blade. I dissected down to the abdominal fascia with blunt dissection.  The fascia was incised vertically and the peritoneal cavity was entered bluntly.  A pursestring suture of 0-Vicryl was placed around the fascial opening.  The Hasson cannula was inserted and secured with the stay suture.   Pneumoperitoneum was then created with CO2 and tolerated well without any adverse changes in the patient's vital signs. An 11-mm port was placed in the subxiphoid position.  Two 5-mm ports were placed in the right upper quadrant. All skin incisions were infiltrated with a local anesthetic agent before making the incision and placing the trocars.   The patient was placed into reverse Trendelenburg position and was tilted slightly to the patient's left.  The gallbladder was identified, the fundus grasped and retracted cephalad. Adhesions were lysed bluntly and with the electrocautery where indicated, taking care not to injure any adjacent organs or viscus. The infundibulum was grasped and retracted laterally, exposing the peritoneum overlying the triangle of Calot. The cystic duct and cystic artery were identified.  These were both skeletonized.  A window between the gallbladder and the liver was obtained to ensure a critical view.    The cystic artery was ligated with clips and divided as it was anterior to the duct.  The cystic duct was ligated with a clip distally.   An incision was made in the cystic duct and the [facility] cholangiogram catheter introduced. The catheter was secured using a clip. A cholangiogram was then performed, demonstrating good filling of the right and left hepatic ducts, good filling of the CBD and duodenum without filling defects. .  The cystic duct was then ligated proximally with clips and divided.   The gallbladder was dissected from the liver bed in retrograde fashion with the electrocautery. The gallbladder was removed and placed in an Ecosac.  The gallbladder and bag were then removed through the umbilical port site.  The liver bed was irrigated and inspected. Hemostasis was achieved with the electrocautery. Copious irrigation was utilized and was repeatedly aspirated until clear.    Pneumoperitoneum was released as  we removed the trocars.   The pursestring suture was used to close  the umbilical fascia.    4-0 Monocryl was used to close the skin in subcuticular fashion.   The skin was cleaned and dry, and Dermabond was applied. The patient was then extubated and brought to the recovery room in stable condition. Instrument, sponge, and needle counts were correct at closure and at the conclusion of the case.   Findings: Mild acute inflammation.    Estimated Blood Loss: min         Drains: n/a          Specimens: Gallbladder to pathology       Complications: None; patient tolerated the procedure well.         Disposition: PACU - hemodynamically stable.         Condition: stable

## 2021-07-01 NOTE — Anesthesia Preprocedure Evaluation (Addendum)
Anesthesia Evaluation  Patient identified by MRN, date of birth, ID band Patient awake    Reviewed: Allergy & Precautions, NPO status , Patient's Chart, lab work & pertinent test results  Airway Mallampati: III  TM Distance: >3 FB Neck ROM: Full    Dental no notable dental hx. (+) Dental Advisory Given, Caps   Pulmonary neg pulmonary ROS,    Pulmonary exam normal breath sounds clear to auscultation       Cardiovascular hypertension, Pt. on medications Normal cardiovascular exam+ Valvular Problems/Murmurs MVP and MR  Rhythm:Regular Rate:Normal     Neuro/Psych negative neurological ROS  negative psych ROS   GI/Hepatic Elevated LFT's Cholelithiasis with acute gallstone pancreatitis   Endo/Other  negative endocrine ROS  Renal/GU negative Renal ROS  negative genitourinary   Musculoskeletal negative musculoskeletal ROS (+)   Abdominal   Peds  Hematology  (+) Blood dyscrasia, anemia ,   Anesthesia Other Findings   Reproductive/Obstetrics                           Anesthesia Physical Anesthesia Plan  ASA: 2 and emergent  Anesthesia Plan: General   Post-op Pain Management: Precedex, Dilaudid IV and Ofirmev IV (intra-op)*   Induction: Intravenous, Rapid sequence and Cricoid pressure planned  PONV Risk Score and Plan: 4 or greater and Treatment may vary due to age or medical condition, Midazolam, Ondansetron and Dexamethasone  Airway Management Planned: Oral ETT and Video Laryngoscope Planned  Additional Equipment: None  Intra-op Plan:   Post-operative Plan: Extubation in OR  Informed Consent: I have reviewed the patients History and Physical, chart, labs and discussed the procedure including the risks, benefits and alternatives for the proposed anesthesia with the patient or authorized representative who has indicated his/her understanding and acceptance.     Dental advisory  given  Plan Discussed with: Anesthesiologist and CRNA  Anesthesia Plan Comments:        Anesthesia Quick Evaluation

## 2021-07-01 NOTE — Anesthesia Postprocedure Evaluation (Signed)
Anesthesia Post Note  Patient: Tiffany Cunningham  Procedure(s) Performed: LAPAROSCOPIC CHOLECYSTECTOMY WITH INTRAOPERATIVE CHOLANGIOGRAM (Abdomen)     Patient location during evaluation: PACU Anesthesia Type: General Level of consciousness: awake and alert and oriented Pain management: pain level controlled Vital Signs Assessment: post-procedure vital signs reviewed and stable Respiratory status: spontaneous breathing, nonlabored ventilation and respiratory function stable Cardiovascular status: blood pressure returned to baseline and stable Postop Assessment: no apparent nausea or vomiting Anesthetic complications: no   No notable events documented.  Last Vitals:  Vitals:   07/01/21 1245 07/01/21 1255  BP: 120/62   Pulse: 88 83  Resp: 20 18  Temp:    SpO2: 91% 97%    Last Pain:  Vitals:   07/01/21 1255  TempSrc:   PainSc: 5                  [name]

## 2021-07-01 NOTE — Progress Notes (Signed)
  Progress Note   Patient: Tiffany Cunningham XFG:[id number] DOB: [birth year] DOA: 06/29/2021     1 DOS: the patient was seen and examined on 07/01/2021   Brief hospital course: 66 year old woman presenting with epigastric abdominal pain.  Admitted for acute pancreatitis. Further evaluation revealed cholelithiasis without evidence of acute cholecystitis, MRCP showed pancreatitis without acute complicating features.  No choledocholithiasis.  Improving. General surgery consulted with plans for laparoscopic cholecystectomy 5/27.  Assessment and Plan: * Acute gallstone pancreatitis -- Most likely gallstone pancreatitis, numerous gallstones seen on ultrasound.  MRCP negative for choledocholithiasis. No alcohol. TG WNL. FHx autoimmune pancreatitis -- GI evaluating. -- Much better today symptomatically.  AST and ALT trending down, lipase not repeated. Continue analgesia as needed, IV fluids. -- Repeat labs in AM. --stop abx --per MRCP: radiologist "skeptical of pancreatic malignancy but given the focal nature of the findings in the pancreatic body, surveillance imaging to ensure complete resolution may be warranted.    Essential hypertension --Continue home norvasc  Aortic atherosclerosis (HCC) --can consider statin as outpatient        Subjective:  Feels fine, a little achy  Physical Exam: Vitals:   06/30/21 1420 06/30/21 1933 07/01/21 0346 07/01/21 0912  BP: 124/62 133/67 130/66 113/64  Pulse: 99 (!) 103 91   Resp: 19 20 18    Temp: 99.6 F (37.6 C) 99.5 F (37.5 C) 98.5 F (36.9 C)   TempSrc: Oral Oral Oral   SpO2: 93% 92% 92%   Weight:      Height:       Physical Exam Vitals reviewed.  Constitutional:      General: She is not in acute distress.    Appearance: She is not ill-appearing or toxic-appearing.  Cardiovascular:     Rate and Rhythm: Normal rate and regular rhythm.     Heart sounds: No murmur heard. Pulmonary:     Effort: Pulmonary effort is normal. No respiratory  distress.     Breath sounds: No wheezing, rhonchi or rales.  Neurological:     Mental Status: She is alert.  Psychiatric:        Mood and Affect: Mood normal.        Behavior: Behavior normal.    Data Reviewed:  K+ 3.2, will replete AST down to 86 ALT down to 286 Lipase WNL Hgb down to 10.7  Family Communication: none  Disposition: Status is: Inpatient Remains inpatient appropriate because: acute pancreatitis, needs cholecystectomy  Planned Discharge Destination: Home    Time spent: 20 minutes  Author: , MD 07/01/2021 10:04 AM  For on call review www.07/03/2021.

## 2021-07-01 NOTE — Interval H&P Note (Signed)
History and Physical Interval Note:  07/01/2021 8:58 AM  Tiffany Cunningham  has presented today for surgery, with the diagnosis of gallstones.  The various methods of treatment have been discussed with the patient and family. After consideration of risks, benefits and other options for treatment, the patient has consented to  Procedure(s): LAPAROSCOPIC CHOLECYSTECTOMY WITH INTRAOPERATIVE CHOLANGIOGRAM (N/A) as a surgical intervention.  The patient's history has been reviewed, patient examined, no change in status, stable for surgery.  I have reviewed the patient's chart and labs.  Questions were answered to the patient's satisfaction.     Stark Klein

## 2021-07-01 NOTE — Anesthesia Procedure Notes (Signed)
Procedure Name: Intubation Date/Time: 07/01/2021 11:09 AM Performed by: Montel Clock, CRNA Pre-anesthesia Checklist: Patient identified, Emergency Drugs available, Suction available, Patient being monitored and Timeout performed Patient Re-evaluated:Patient Re-evaluated prior to induction Oxygen Delivery Method: Circle system utilized Preoxygenation: Pre-oxygenation with 100% oxygen Induction Type: IV induction, Rapid sequence and Cricoid Pressure applied Laryngoscope Size: 3 and Glidescope Grade View: Grade II Tube type: Oral Tube size: 7.0 mm Number of attempts: 1 Airway Equipment and Method: Stylet Placement Confirmation: ETT inserted through vocal cords under direct vision, positive ETCO2 and breath sounds checked- equal and bilateral Secured at: 21 cm Tube secured with: Tape Dental Injury: Teeth and Oropharynx as per pre-operative assessment  Difficulty Due To: Difficult Airway- due to anterior larynx Comments: Glidescope elected r/t recessed chin.

## 2021-07-01 NOTE — Plan of Care (Signed)
  Problem: Education: Goal: Knowledge of General Education information will improve Description Including pain rating scale, medication(s)/side effects and non-pharmacologic comfort measures Outcome: Progressing   Problem: Clinical Measurements: Goal: Ability to maintain clinical measurements within normal limits will improve Outcome: Progressing Goal: Diagnostic test results will improve Outcome: Progressing   Problem: Pain Managment: Goal: General experience of comfort will improve Outcome: Progressing   

## 2021-07-02 ENCOUNTER — Encounter (HOSPITAL_COMMUNITY): Payer: Self-pay | Admitting: General Surgery

## 2021-07-02 DIAGNOSIS — K851 Biliary acute pancreatitis without necrosis or infection: Secondary | ICD-10-CM | POA: Diagnosis not present

## 2021-07-02 LAB — COMPREHENSIVE METABOLIC PANEL
ALT: 271 U/L — ABNORMAL HIGH (ref 0–44)
AST: 98 U/L — ABNORMAL HIGH (ref 15–41)
Albumin: 3.1 g/dL — ABNORMAL LOW (ref 3.5–5.0)
Alkaline Phosphatase: 83 U/L (ref 38–126)
Anion gap: 8 (ref 5–15)
BUN: 13 mg/dL (ref 8–23)
CO2: 27 mmol/L (ref 22–32)
Calcium: 8.1 mg/dL — ABNORMAL LOW (ref 8.9–10.3)
Chloride: 103 mmol/L (ref 98–111)
Creatinine, Ser: 0.53 mg/dL (ref 0.44–1.00)
GFR, Estimated: >60 mL/min (ref >60)
Glucose, Bld: 113 mg/dL — ABNORMAL HIGH (ref 70–99)
Potassium: 3.8 mmol/L (ref 3.5–5.1)
Sodium: 138 mmol/L (ref 135–145)
Total Bilirubin: 0.7 mg/dL (ref 0.3–1.2)
Total Protein: 6.2 g/dL — ABNORMAL LOW (ref 6.5–8.1)

## 2021-07-02 MED ORDER — OXYCODONE HCL 5 MG PO TABS: 5.0000 mg | ORAL_TABLET | ORAL | 0 refills | Status: DC | PRN

## 2021-07-02 NOTE — Progress Notes (Signed)
1 Day Post-Op   Subjective/Chief Complaint: Doing well. Denies n/v.  Tolerated breakfast.  + flatus. Pain controlled.    Objective: Vital signs in last 24 hours: Temp:  [97.5 F (36.4 C)-98.7 F (37.1 C)] 98.6 F (37 C) (05/28 0631) Pulse Rate:  [68-90] 68 (05/28 0631) Resp:  [13-20] 16 (05/28 0631) BP: (110-136)/(54-72) 121/60 (05/28 0631) SpO2:  [90 %-100 %] 94 % (05/28 0631) Last BM Date : 06/29/21  Intake/Output from previous day: 05/27 0701 - 05/28 0700 In: 1180 [P.O.:480; I.V.:700] Out: 360 [Urine:350; Blood:10] Intake/Output this shift: No intake/output data recorded.  General appearance: alert, cooperative, and no distress Resp: breathing comfortably GI: soft, sl distended, minimal tenderness at incisions.    Up and ambulating.    Lab Results:  Recent Labs    06/30/21 0400 07/01/21 0326  WBC 7.9 9.1  HGB 11.8* 10.7*  HCT 36.3 31.9*  PLT 255 209   BMET Recent Labs    07/01/21 0326 07/02/21 0330  NA 138 138  K 3.2* 3.8  CL 103 103  CO2 27 27  GLUCOSE 101* 113*  BUN 10 13  CREATININE 0.49 0.53  CALCIUM 8.2* 8.1*   PT/INR No results for input(s): LABPROT, INR in the last 72 hours. ABG No results for input(s): PHART, HCO3 in the last 72 hours.  Invalid input(s): PCO2, PO2  Studies/Results: DG Cholangiogram Operative  Result Date: 07/01/2021 CLINICAL DATA:  Intraoperative cholangiogram Cholelithiasis Pancreatitis EXAM: INTRAOPERATIVE CHOLANGIOGRAM TECHNIQUE: Cholangiographic images from the C-arm fluoroscopic device were submitted for interpretation post-operatively. Please see the procedural report for the amount of contrast and the fluoroscopy time utilized. FLUOROSCOPY: Radiation Exposure Index (as provided by the fluoroscopic device): 4 mGy Kerma COMPARISON:  MRI abdomen 06/30/2021 FINDINGS: Single intraoperative cine clip of cholangiogram was provided for interpretation. Submitted images demonstrate opacification of the intra and extrahepatic  bile ducts through cystic duct remnant. No filling defects are identified within the common bile duct. There is narrowing of the distal common bile duct as it enters the duodenum, most likely due to pancreatic inflammation. IMPRESSION: Intraoperative cholangiogram demonstrates no filling defects within the common bile duct. Electronically Signed   By: Acquanetta Belling M.D.   On: 07/01/2021 12:35    Anti-infectives: Anti-infectives (From admission, onward)    Start     Dose/Rate Route Frequency Ordered Stop   07/01/21 1035  sodium chloride 0.9 % with cefTRIAXone (ROCEPHIN) ADS Med       Note to Pharmacy: Lyda Kalata R: cabinet override      07/01/21 1035 07/01/21 1403   06/30/21 0500  cefTRIAXone (ROCEPHIN) 2 g in sodium chloride 0.9 % 100 mL IVPB  Status:  Discontinued        2 g 200 mL/hr over 30 Minutes Intravenous Every 24 hours 06/30/21 0429 06/30/21 1635   06/30/21 0500  metroNIDAZOLE (FLAGYL) IVPB 500 mg  Status:  Discontinued        500 mg 100 mL/hr over 60 Minutes Intravenous Every 12 hours 06/30/21 0429 06/30/21 1635       Assessment/Plan: s/p Procedure(s): LAPAROSCOPIC CHOLECYSTECTOMY WITH INTRAOPERATIVE CHOLANGIOGRAM (N/A) Doing well post op. Stable surgically for discharge.  Narcotic script written and d/c instructions/follow up in chart.     LOS: 2 days    Almond Lint 07/02/2021

## 2021-07-02 NOTE — Discharge Summary (Addendum)
Physician Discharge Summary   Patient: Tiffany Cunningham MRN: [id number] DOB: 1955/08/13  Admit date:     06/29/2021  Discharge date: 07/02/21  Discharge Physician: Brendia Sacks   PCP: Ladora Khyler Eschmann, PA-C   Recommendations at discharge:  {Tip this will not be part of the note when signed- Example include specific recommendations for outpatient follow-up, pending tests to follow-up on.  * Acute gallstone pancreatitis -- GI evaluating for autoimmune pancreatitis; will follow-up as an outpaient. -- Better today.  AST and ALT elevated; suggest repeat hepatic function panel as outpatient to ensure resolution --per MRCP: radiologist "skeptical of pancreatic malignancy but given the focal nature of the findings in the pancreatic body, surveillance imaging to ensure complete resolution may be warranted." Defer to PCP and GI as outpatient  Holosystolic murmur RUSB --consider outpatient echocardiogram to evaluate valves. Echo from 2017 unrevealing.  Discharge Diagnoses: Principal Problem:   Acute gallstone pancreatitis Active Problems:   Essential hypertension   Aortic atherosclerosis (HCC)  Resolved Problems:   * No resolved hospital problems. *  Hospital Course: 66 year old woman presenting with epigastric abdominal pain.  Admitted for acute pancreatitis. Further evaluation revealed cholelithiasis without evidence of acute cholecystitis, MRCP showed pancreatitis without acute complicating features.  No choledocholithiasis.  Improving. General surgery and GI consulted . GI will follow-up as an outpatient in regard to FHx of autoimmune pancreatitis. S/p laparoscopic cholecystectomy 5/27. IOP was negative. Hospitalization uncomplicated.   * Acute gallstone pancreatitis -- Numerous gallstones seen on ultrasound.  MRCP negative for choledocholithiasis. No alcohol. TG WNL. FHx autoimmune pancreatitis  -- GI evaluating for autoimmune pancreatitis; will follow-up as an outpaient. -- Better today.   AST and ALT stable; lipase WNL --per MRCP: radiologist "skeptical of pancreatic malignancy but given the focal nature of the findings in the pancreatic body, surveillance imaging to ensure complete resolution may be warranted." Defer to PCP and GI as outpatient  Essential hypertension --Continue home norvasc  Aortic atherosclerosis (HCC) --can consider statin as outpatient  Holosystolic murmur RUSB --consider outpatient echocardiogram to evaluate valves. Echo from 2017 unrevealing.      Consultants:  General surgery GI Procedures performed:  Laparoscopic cholecystectomy and IOP  Disposition: Home Diet recommendation:  Discharge Diet Orders (From admission, onward)     Start     Ordered   07/02/21 0000  Diet - low sodium heart healthy        07/02/21 0917           Low fat diet  Counseled to hold Voltaren for a week DISCHARGE MEDICATION: Allergies as of 07/02/2021       Reactions   Ace Inhibitors Cough        Medication List     TAKE these medications    acetaminophen 325 MG tablet Commonly known as: TYLENOL Take 325-650 mg by mouth every 6 (six) hours as needed (for pain).   amLODipine 5 MG tablet Commonly known as: NORVASC Take 5 mg by mouth daily.   Azelastine-Fluticasone 137-50 MCG/ACT Susp Place 1 spray into the nose 2 (two) times daily as needed. What changed: reasons to take this   diclofenac 75 MG EC tablet Commonly known as: VOLTAREN Take 75 mg by mouth 2 (two) times daily.   DULoxetine 60 MG capsule Commonly known as: CYMBALTA Take 60 mg by mouth daily.   Fish Oil 1000 MG Caps Take 3 capsules by mouth daily.   losartan-hydrochlorothiazide 100-25 MG tablet Commonly known as: HYZAAR Take 1 tablet by mouth daily.  Magnesium Oxide 250 MG Tabs Take 250 mg by mouth at bedtime. What changed: Another medication with the same name was removed. Continue taking this medication, and follow the directions you see here.   multivitamin with  minerals Tabs tablet Take 1 tablet by mouth daily.   oxyCODONE 5 MG immediate release tablet Commonly known as: Oxy IR/ROXICODONE Take 1-2 tablets (5-10 mg total) by mouth every 4 (four) hours as needed for moderate pain, severe pain or breakthrough pain.   SYSTANE OP Place 1 drop into both eyes in the morning and at bedtime.   TUMS PO Take 1 tablet by mouth 2 (two) times daily as needed (stomach upset).        Follow-up Information     [facility] Follow up on 07/14/2021.   Specialty: General Surgery Why: Please arrive 30 minutes prior to your appointment time for check in and paperwork process Contact information: [address] [place] [address] [phone]         Vida Rigger, MD. Schedule an appointment as soon as possible for a visit in 4 week(s).   Specialty: Gastroenterology Contact information: [address] [place] [address] [phone]                Feels good  Discharge Exam: Ceasar Mons Weights   06/29/21 2220 06/30/21 0300  Weight: 74.6 kg 74.3 kg   Physical Exam Vitals reviewed.  Constitutional:      General: She is not in acute distress.    Appearance: She is not ill-appearing or toxic-appearing.  Cardiovascular:     Rate and Rhythm: Normal rate and regular rhythm.     Heart sounds: No murmur (Holosystolic RUSB) heard. Neurological:     Mental Status: She is alert.  Psychiatric:        Mood and Affect: Mood normal.        Behavior: Behavior normal.     Condition at discharge: good  The results of significant diagnostics from this hospitalization (including imaging, microbiology, ancillary and laboratory) are listed below for reference.   Imaging Studies: DG Cholangiogram Operative  Result Date: 07/01/2021 CLINICAL DATA:  Intraoperative cholangiogram Cholelithiasis Pancreatitis EXAM: INTRAOPERATIVE CHOLANGIOGRAM TECHNIQUE: Cholangiographic images from the C-arm fluoroscopic device were  submitted for interpretation post-operatively. Please see the procedural report for the amount of contrast and the fluoroscopy time utilized. FLUOROSCOPY: Radiation Exposure Index (as provided by the fluoroscopic device): 4 mGy Kerma COMPARISON:  MRI abdomen 06/30/2021 FINDINGS: Single intraoperative cine clip of cholangiogram was provided for interpretation. Submitted images demonstrate opacification of the intra and extrahepatic bile ducts through cystic duct remnant. No filling defects are identified within the common bile duct. There is narrowing of the distal common bile duct as it enters the duodenum, most likely due to pancreatic inflammation. IMPRESSION: Intraoperative cholangiogram demonstrates no filling defects within the common bile duct. Electronically Signed   By: Acquanetta Belling M.D.   On: 07/01/2021 12:35   CT ABDOMEN PELVIS W CONTRAST  Result Date: 06/29/2021 CLINICAL DATA:  Left upper quadrant abdominal pain EXAM: CT ABDOMEN AND PELVIS WITH CONTRAST TECHNIQUE: Multidetector CT imaging of the abdomen and pelvis was performed using the standard protocol following bolus administration of intravenous contrast. RADIATION DOSE REDUCTION: This exam was performed according to the departmental dose-optimization program which includes automated exposure control, adjustment of the mA and/or kV according to patient size and/or use of iterative reconstruction technique. CONTRAST:  80mL OMNIPAQUE IOHEXOL 300 MG/ML  SOLN COMPARISON:  None Available. FINDINGS: Lower chest: Visualized lung bases demonstrate discoid atelectasis. Visualized heart and pericardium are unremarkable. Hepatobiliary: Moderate hepatic steatosis. No enhancing intrahepatic mass. No intra or extrahepatic biliary ductal dilation. Gallbladder unremarkable. Pancreas: Mild peripancreatic acute inflammatory fluid is identified surrounding the head and body of the pancreas in keeping with changes of acute edematous/interstitial pancreatitis. Normal  enhancement of the pancreatic parenchyma. No pancreatic or peripancreatic necrosis. The pancreatic duct is not dilated. No loculated peripancreatic fluid collections are identified. Spleen: Unremarkable Adrenals/Urinary Tract: Adrenal glands are unremarkable. Kidneys are normal, without renal calculi, focal lesion, or hydronephrosis. Bladder is unremarkable. Stomach/Bowel: Moderate descending and sigmoid colonic diverticulosis. No superimposed acute inflammatory change. The stomach, small bowel, and large bowel are otherwise unremarkable. Appendix absent. No free intraperitoneal gas or fluid. Vascular/Lymphatic: Minimal atherosclerotic calcification within the abdominal aorta. No aortic aneurysm. Retroaortic left renal vein. Mild shotty peripancreatic and periportal adenopathy is likely reactive in nature. No frankly pathologic adenopathy within the abdomen and pelvis. Reproductive: Uterus and bilateral adnexa are unremarkable. Other: Small fat containing bilateral inguinal hernias. Musculoskeletal: No acute bone abnormality. No lytic or blastic bone lesion. IMPRESSION: Mild acute interstitial/edematous pancreatitis. No pancreatic or peripancreatic necrosis. No loculated peripancreatic fluid collections. Associated shotty probable reactive peripancreatic and periportal adenopathy. Moderate hepatic steatosis. Moderate distal colonic diverticulosis without superimposed acute inflammatory change. Aortic Atherosclerosis (ICD10-I70.0). Electronically Signed   By: Helyn Numbers M.D.   On: 06/29/2021 23:44   MR ABDOMEN MRCP WO CONTRAST  Result Date: 06/30/2021 CLINICAL DATA:  Suspected pancreatitis. Cholelithiasis. Biliary dilatation. EXAM: MRI ABDOMEN WITHOUT CONTRAST  (INCLUDING MRCP) TECHNIQUE: Multiplanar multisequence MR imaging of the abdomen was performed. Heavily T2-weighted images of the biliary and pancreatic ducts were obtained, and three-dimensional MRCP images were rendered by post processing. COMPARISON:   CT abdomen 04/29/2021 FINDINGS: Lower chest: Bandlike atelectasis or scarring in both lower lobes. Hepatobiliary: Innumerable small gallstones are present in the gallbladder. Common bile duct 0.6 cm in diameter, upper normal size for age. No filling defect is observed in the common bile duct. No significant hepatic parenchymal lesion is identified on noncontrast exam. Diffuse hepatic steatosis noted. Pancreas: Accentuated T2 signal in the pancreas is most striking in the pancreatic body for example on image 25 series 3. There is only subtle accentuation of diffusion restriction in this area. Otherwise there is mild peripancreatic edema. Partial pancreas divisum with a small narrowed segment of the dorsal pancreatic duct at the branch point to the minor papilla for example on image 51 series 8. No pseudocyst or abscess identified. Spleen:  Unremarkable Adrenals/Urinary Tract:  Unremarkable Stomach/Bowel: Unremarkable Vascular/Lymphatic: Likely reactive lymph nodes in the porta hepatis include a 1.2 cm portacaval node on image 22 series 3 and a 0.9 cm peripancreatic lymph node on image 22 series 3. Other:  No supplemental non-categorized findings. Musculoskeletal: Unremarkable IMPRESSION: 1. Edema signal in the pancreatic body parenchyma, along with peripancreatic edema, compatible with acute focal pancreatitis. I am skeptical of pancreatic malignancy but given the focal nature of the findings in the pancreatic body, surveillance imaging to ensure complete resolution may be warranted. No pancreatic abscess or pseudocyst currently identified. 2. Numerous tiny gallstones in the gallbladder without findings of choledocholithiasis. The common bile duct is currently 0.6 cm in diameter which is in the upper normal range. 3. Diffuse hepatic steatosis. Electronically Signed   By: Gaylyn Rong M.D.   On: 06/30/2021 09:03   MR 3D Recon At Scanner  Result Date: 06/30/2021 CLINICAL DATA:  Suspected pancreatitis.  Cholelithiasis. Biliary dilatation. EXAM: MRI ABDOMEN WITHOUT CONTRAST  (INCLUDING MRCP) TECHNIQUE: Multiplanar multisequence MR imaging of the abdomen was performed. Heavily T2-weighted images of the biliary and pancreatic ducts were obtained, and three-dimensional MRCP images were rendered by post processing. COMPARISON:  CT abdomen 04/29/2021 FINDINGS: Lower chest: Bandlike atelectasis or scarring in both lower lobes. Hepatobiliary: Innumerable small gallstones are present in the gallbladder. Common bile duct 0.6 cm in diameter, upper normal size for age. No filling defect is observed in the common bile duct. No significant hepatic parenchymal lesion is identified on noncontrast exam. Diffuse hepatic steatosis noted. Pancreas: Accentuated T2 signal in the pancreas is most striking in the pancreatic body for example on image 25 series 3. There is only subtle accentuation of diffusion restriction in this area. Otherwise there is mild peripancreatic edema. Partial pancreas divisum with a small narrowed segment of the dorsal pancreatic duct at the branch point to the minor papilla for example on image 51 series 8. No pseudocyst or abscess identified. Spleen:  Unremarkable Adrenals/Urinary Tract:  Unremarkable Stomach/Bowel: Unremarkable Vascular/Lymphatic: Likely reactive lymph nodes in the porta hepatis include a 1.2 cm portacaval node on image 22 series 3 and a 0.9 cm peripancreatic lymph node on image 22 series 3. Other:  No supplemental non-categorized findings. Musculoskeletal: Unremarkable IMPRESSION: 1. Edema signal in the pancreatic body parenchyma, along with peripancreatic edema, compatible with acute focal pancreatitis. I am skeptical of pancreatic malignancy but given the focal nature of the findings in the pancreatic body, surveillance imaging to ensure complete resolution may be warranted. No pancreatic abscess or pseudocyst currently identified. 2. Numerous tiny gallstones in the gallbladder without  findings of choledocholithiasis. The common bile duct is currently 0.6 cm in diameter which is in the upper normal range. 3. Diffuse hepatic steatosis. Electronically Signed   By: Gaylyn RongWalter  Liebkemann M.D.   On: 06/30/2021 09:03   US Abdomen Limited RUQ (LIVER/GB)  Result Date: 06/30/2021 CLINICAL DATA:  Acute pancreatitis EXAM: ULTRASOUND ABDOMEN LIMITED RIGHT UPPER QUADRANT COMPARISON:  CT 05/29/2016 FINDINGS: Gallbladder: Multiple layering gallstones are seen within the gallbladder. The gallbladder, however, is not distended, there is no gallbladder wall thickening, and no pericholecystic fluid is identified. The sonographic Eulah PontMurphy sign is reportedly negative. Common bile duct: Diameter: Dilated measuring 8 mm in proximal diameter. This appears new since prior CT examination. Liver: Hepatic parenchymal echogenicity is diffusely increased and there is diffuse coarsening of the hepatic echotexture in keeping with mild hepatic steatosis. No focal intrahepatic masses are seen and there is no intrahepatic biliary ductal dilation. Portal vein is patent on color Doppler imaging with normal direction of blood flow towards the liver. Other: None. IMPRESSION: Cholelithiasis without sonographic evidence of acute cholecystitis. Dilation of the extrahepatic bile duct. A distal obstructing lesion is not excluded. ERCP or MRCP examination may be more helpful for further evaluation. Mild hepatic steatosis. Electronically Signed   By: Helyn NumbersAshesh  Parikh M.D.   On: 06/30/2021 04:02    Microbiology: Results for orders placed or performed during the hospital encounter of 06/29/21  Urine Culture     Status: None   Collection Time: 06/30/21  7:21 AM   Specimen: Urine, Clean Catch  Result Value Ref Range Status   Specimen Description   Final    URINE, CLEAN CATCH Performed at [facility], [address] [place], [place] [id number]    Special Requests   Final    NONE Performed at [facility], [address] [place],  [place] [address]    Culture   Final    NO GROWTH Performed at [facility], [address], [place], [place] [address]    Report Status 07/01/2021 FINAL  Final  Surgical pcr screen     Status: None   Collection Time: 07/01/21  7:31 AM   Specimen: Nasal Mucosa; Nasal Swab  Result Value Ref Range Status   MRSA, PCR NEGATIVE NEGATIVE Final   Staphylococcus aureus NEGATIVE NEGATIVE Final    Comment: (NOTE) The Xpert SA Assay (FDA approved for NASAL specimens in patients 22 years of age and older), is one component of a comprehensive surveillance program. It is not intended to diagnose infection nor to guide or monitor treatment. Performed at [facility], [address] [place], [place] [address]     Labs: CBC: Recent Labs  Lab 06/29/21 2231 06/30/21 0400 07/01/21 0326  WBC 13.0* 7.9 9.1  NEUTROABS  --   --  6.7  HGB 13.1 11.8* 10.7*  HCT 39.7 36.3 31.9*  MCV 86.9 90.3 91.1  PLT 327 255 209   Basic Metabolic Panel: Recent Labs  Lab 06/29/21 2231 06/30/21 0400 07/01/21 0326 07/02/21 0330  NA 140 142 138 138  K 3.2* 3.4* 3.2* 3.8  CL 99 104 103 103  CO2 GLUCOSE 118* 116* 101* 113*  BUN CREATININE 0.68 0.59 0.49 0.53  CALCIUM 9.4 8.7* 8.2* 8.1*   Liver Function Tests: Recent Labs  Lab 06/29/21 2231 06/30/21 0400 07/01/21 0326 07/02/21 0330  AST 362* 262* 86* 98*  ALT 630* 536* 286* 271*  ALKPHOS 127* 103 82 83  BILITOT 0.9 0.8 1.0 0.7  PROT 7.3 6.7 5.8* 6.2*  ALBUMIN 4.2 3.7 3.0* 3.1*   CBG: No results for input(s): GLUCAP in the last 168 hours.  Discharge time spent: less than 30 minutes.  Signed: Brendia Sacks, MD [facility] 07/02/2021

## 2021-07-04 LAB — SURGICAL PATHOLOGY

## 2022-01-19 ENCOUNTER — Encounter: Payer: Self-pay | Admitting: Physical Medicine & Rehabilitation

## 2022-03-21 ENCOUNTER — Encounter (HOSPITAL_BASED_OUTPATIENT_CLINIC_OR_DEPARTMENT_OTHER): Payer: Self-pay

## 2022-03-21 ENCOUNTER — Other Ambulatory Visit: Payer: Self-pay

## 2022-03-21 ENCOUNTER — Emergency Department (HOSPITAL_BASED_OUTPATIENT_CLINIC_OR_DEPARTMENT_OTHER)
Admission: EM | Admit: 2022-03-21 | Discharge: 2022-03-21 | Disposition: A | Discharge status: Home / Self Care | Payer: No Typology Code available for payment source | Attending: Emergency Medicine | Admitting: Emergency Medicine

## 2022-03-21 DIAGNOSIS — M62838 Other muscle spasm: Secondary | ICD-10-CM | POA: Insufficient documentation

## 2022-03-21 DIAGNOSIS — Z79899 Other long term (current) drug therapy: Secondary | ICD-10-CM | POA: Insufficient documentation

## 2022-03-21 DIAGNOSIS — I10 Essential (primary) hypertension: Secondary | ICD-10-CM | POA: Diagnosis not present

## 2022-03-21 DIAGNOSIS — M542 Cervicalgia: Secondary | ICD-10-CM | POA: Diagnosis present

## 2022-03-21 MED ORDER — LIDOCAINE 5 % EX PTCH
1.0000 | MEDICATED_PATCH | Freq: Once | CUTANEOUS | Status: DC
  Administered 2022-03-21: 3 via TRANSDERMAL
  Filled 2022-03-21: qty 3

## 2022-03-21 MED ORDER — ACETAMINOPHEN 500 MG PO TABS
1000.0000 mg | ORAL_TABLET | Freq: Once | ORAL | Status: AC
  Administered 2022-03-21: 1000 mg via ORAL
  Filled 2022-03-21: qty 2

## 2022-03-21 MED ORDER — LIDOCAINE 5 % EX PTCH: 1.0000 | MEDICATED_PATCH | CUTANEOUS | 0 refills | Status: DC

## 2022-03-21 MED ORDER — DIAZEPAM 5 MG PO TABS
5.0000 mg | ORAL_TABLET | Freq: Once | ORAL | Status: AC
  Administered 2022-03-21: 5 mg via ORAL
  Filled 2022-03-21: qty 1

## 2022-03-21 MED ORDER — CYCLOBENZAPRINE HCL 10 MG PO TABS: 10.0000 mg | ORAL_TABLET | Freq: Two times a day (BID) | ORAL | 0 refills | Status: DC | PRN

## 2022-03-21 NOTE — ED Provider Notes (Signed)
[facility] Provider Note   CSN: [id number] Arrival date & time: 03/21/22  1014     History  Chief Complaint  Patient presents with   Torticollis    Tiffany Cunningham is a 67 y.o. female.  Patient is a 67 year old female with a past medical history of hypertension presenting to the emergency department with neck pain.  The patient states that she has had pain to her bilateral neck since last Thursday.  She states this started on the left side and radiated to the right.  She states that she had an associated viral syndrome with low-grade fevers, cough and congestion that is essentially resolved that she has continued to have the pain.  She states that she has been taking Tylenol, Motrin, ice and using tizanidine without any relief.  She denies any numbness or weakness.  She denies any headaches.  The history is provided by the patient.       Home Medications Prior to Admission medications   Medication Sig Start Date End Date Taking? Authorizing Provider  cyclobenzaprine (FLEXERIL) 10 MG tablet Take 1 tablet (10 mg total) by mouth 2 (two) times daily as needed for muscle spasms. 03/21/22  Yes Maylon Peppers, [place] K, DO  lidocaine (LIDODERM) 5 % Place 1 patch onto the skin daily. Remove & Discard patch within 12 hours or as directed by MD 03/21/22  Yes Maylon Peppers, Jordan Hawks K, DO  amLODipine (NORVASC) 5 MG tablet Take 5 mg by mouth daily. 04/30/21   [provider]  Azelastine-Fluticasone 137-50 MCG/ACT SUSP Place 1 spray into the nose 2 (two) times daily as needed. Patient taking differently: Place 1 spray into the nose 2 (two) times daily as needed (allergies). 06/12/21   Garnet Sierras, DO  Calcium Carbonate Antacid (TUMS PO) Take 1 tablet by mouth 2 (two) times daily as needed (stomach upset).    [provider]  diclofenac (VOLTAREN) 75 MG EC tablet Take 75 mg by mouth 2 (two) times daily. 06/02/21   [provider]  DULoxetine  (CYMBALTA) 60 MG capsule Take 60 mg by mouth daily. 10/23/19   [provider]  losartan-hydrochlorothiazide (HYZAAR) 100-25 MG tablet Take 1 tablet by mouth daily.    [provider]  Magnesium Oxide 250 MG TABS Take 250 mg by mouth at bedtime.    [provider]  Multiple Vitamin (MULTIVITAMIN WITH MINERALS) TABS tablet Take 1 tablet by mouth daily.    [provider]  Omega-3 Fatty Acids (FISH OIL) 1000 MG CAPS Take 3 capsules by mouth daily.    [provider]  oxyCODONE (OXY IR/ROXICODONE) 5 MG immediate release tablet Take 1-2 tablets (5-10 mg total) by mouth every 4 (four) hours as needed for moderate pain, severe pain or breakthrough pain. 07/02/21   Stark Klein, MD  Polyethyl Glycol-Propyl Glycol (SYSTANE OP) Place 1 drop into both eyes in the morning and at bedtime.    [provider]      Allergies    Ace inhibitors    Review of Systems   Review of Systems  Physical Exam Updated Vital Signs BP 137/68 (BP Location: Right Arm)   Pulse 78   Temp 98.3 F (36.8 C)   Resp 16   LMP  (LMP Unknown)   SpO2 100%  Physical Exam Vitals and nursing note reviewed.  Constitutional:      General: She is not in acute distress.    Appearance: Normal appearance. She is not ill-appearing  or toxic-appearing.  HENT:     Head: Normocephalic and atraumatic.     Nose: Nose normal.     Mouth/Throat:     Mouth: Mucous membranes are moist.     Pharynx: Oropharynx is clear.  Eyes:     Extraocular Movements: Extraocular movements intact.     Conjunctiva/sclera: Conjunctivae normal.  Neck:     Comments: No midline neck tenderness Bilateral trapezius muscle tenderness to palpation with ropy tight feeling to palpation Negative Kernig and Brudzinski signs Cardiovascular:     Rate and Rhythm: Normal rate and regular rhythm.     Pulses: Normal pulses.     Heart sounds: Normal heart sounds.  Pulmonary:     Effort: Pulmonary effort is normal.      Breath sounds: Normal breath sounds.  Abdominal:     General: Abdomen is flat.     Palpations: Abdomen is soft.     Tenderness: There is no abdominal tenderness.  Musculoskeletal:        General: Normal range of motion.     Cervical back: Normal range of motion and neck supple. No rigidity.  Skin:    General: Skin is warm and dry.  Neurological:     General: No focal deficit present.     Mental Status: She is alert and oriented to person, place, and time.     Sensory: No sensory deficit.     Motor: No weakness.  Psychiatric:        Mood and Affect: Mood normal.        Behavior: Behavior normal.     ED Results / Procedures / Treatments   Labs (all labs ordered are listed, but only abnormal results are displayed) Labs Reviewed - No data to display  EKG None  Radiology No results found.  Procedures Procedures    Medications Ordered in ED Medications  lidocaine (LIDODERM) 5 % 1-3 patch (3 patches Transdermal Patch Applied 03/21/22 1208)  acetaminophen (TYLENOL) tablet 1,000 mg (1,000 mg Oral Given 03/21/22 1211)  diazepam (VALIUM) tablet 5 mg (5 mg Oral Given 03/21/22 1212)    ED Course/ Medical Decision Making/ A&P Clinical Course as of 03/21/22 1316  Wed Mar 21, 2022  1309 Upon reassessment, the patient reports improvement of her pain and range of motion.  She is stable for discharge home and was recommended primary care follow-up for likely PT.  She was given strict return precautions. [VK]    Clinical Course User Index [VK] Kemper Durie, DO                             Medical Decision Making This patient presents to the ED with chief complaint(s) of neck pain with pertinent past medical history of HTN which further complicates the presenting complaint. The complaint involves an extensive differential diagnosis and also carries with it a high risk of complications and morbidity.    The differential diagnosis includes muscle strain/spasm, no bony  tenderness or trauma making fracture unlikely, no neurologic deficits making final cord injury or radiculopathy, no signs or symptoms concerning of meningitis  Additional history obtained: Additional history obtained from spouse Records reviewed Primary Care Documents and urgent care records  ED Course and Reassessment: Patient was awake alert nontoxic well-appearing on arrival no acute distress.  She has significant trapezius muscle tenderness bilaterally with tight and ropey sensation consistent with muscle spasm.  She will be given Valium, Tylenol and lidocaine patch  will be closely reassessed.  Independent labs interpretation:  N/A  Independent visualization of imaging: N/A  Consultation: - Consulted or discussed management/test interpretation w/ external professional: N/A  Consideration for admission or further workup: Patient has no emergent conditions requiring admission or further work-up at this time and is stable for discharge home with primary care follow-up  Social Determinants of health: N/A    Risk OTC drugs. Prescription drug management.          Final Clinical Impression(s) / ED Diagnoses Final diagnoses:  Trapezius muscle spasm    Rx / DC Orders ED Discharge Orders          Ordered    cyclobenzaprine (FLEXERIL) 10 MG tablet  2 times daily PRN        03/21/22 1315    lidocaine (LIDODERM) 5 %  Every 24 hours        03/21/22 1315              Leanord Asal K, DO 03/21/22 1316

## 2022-03-21 NOTE — ED Triage Notes (Signed)
She reports having a recent viral-type illness. She c/o some neck stiffness since last Thurs., which persists. She was a provider at  Urgent Care, who advised her to come to hospital if it persisted. She denies fever and is in no distress.

## 2022-03-21 NOTE — ED Notes (Signed)
Discharge paperwork given and verbally understood. 

## 2022-03-21 NOTE — Discharge Instructions (Addendum)
The emergency department for your neck pain.  Your symptoms are consistent with a spasm of the trapezius muscles in your shoulders and your neck.  You can continue to take Tylenol and Motrin as needed for pain as well as use ice and heat and the lidocaine patches.  I have changed your muscle relaxer to Flexeril and you can see if this gives you better relief.  You should follow-up with your primary doctor as he would likely benefit from physical therapy due to your prolonged nature of your pain.  You should return to the emergency department if you have severe headaches, significantly worsening pain, numbness or weakness in your arms or if you have any other new or concerning symptoms.

## 2022-03-28 ENCOUNTER — Encounter: Payer: Self-pay | Admitting: Physical Medicine & Rehabilitation

## 2022-03-28 ENCOUNTER — Encounter
Payer: No Typology Code available for payment source | Attending: Physical Medicine & Rehabilitation | Admitting: Physical Medicine & Rehabilitation

## 2022-03-28 VITALS — BP 133/76 | HR 79 | Ht 64.0 in | Wt 161.2 lb

## 2022-03-28 DIAGNOSIS — M797 Fibromyalgia: Secondary | ICD-10-CM | POA: Insufficient documentation

## 2022-03-28 MED ORDER — GABAPENTIN 100 MG PO CAPS
100.0000 mg | ORAL_CAPSULE | Freq: Three times a day (TID) | ORAL | 3 refills | Status: DC
Start: 2022-03-28 — End: 2022-05-30

## 2022-03-28 MED ORDER — TIZANIDINE HCL 2 MG PO TABS: 2.0000 mg | ORAL_TABLET | Freq: Every day | ORAL | 2 refills | Status: DC

## 2022-03-28 NOTE — Patient Instructions (Addendum)
ALWAYS FEEL FREE TO CALL OUR OFFICE WITH ANY PROBLEMS OR QUESTIONS [id number])  **PLEASE NOTE** ALL MEDICATION REFILL REQUESTS (INCLUDING CONTROLLED SUBSTANCES) NEED TO BE MADE AT LEAST 7 DAYS PRIOR TO REFILL BEING DUE. ANY REFILL REQUESTS INSIDE THAT TIME FRAME MAY RESULT IN DELAYS IN RECEIVING YOUR PRESCRIPTION.    COENZYME Q-10, FISH OIL CAPSULES, MAGNESIUM, VITAMIN D     START TIZANIDINE TONIGHT  IN ONE WEEK BEGIN GABAPENTIN 100MG TWICE DAILY FOR 4 DAYS, THEN THREE X DAILY

## 2022-03-28 NOTE — Progress Notes (Signed)
Subjective:    Patient ID: Tiffany Cunningham, female    DOB: 04-14-1955, 67 y.o.   MRN: [id number]  HPI This is initial evaluation for Tiffany Cunningham who is a 67 year old female with a long history of joint pain which has been progressive since 31.  She has seen rheumatology who apparently has done a complete workup and has found no rheumatological diagnosis for symptomatology, which is all the blood workup observed on x-rays of his left but nothing turned up processes.  Apparently she has a sister who has autoimmune disease.  She has seen orthopedics in the past for a plantar tendon tear, left shoulder, hip, and knee. LEFT HIP LABRAL TEAR; TROCHANTERIC BURISITIS, Tear of gluteus medius tendon was repaired in 12/22 by Dr. Marvis Repress. She had shoulder arthroscopy performed in January of the same year. She also tore the lateral meniscus injury requiring athroscopic repair.   Her pain was more localized to her left side but became more diffuse and bilateral around 2009.  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 She finally quite because of the pain.   She complains of diffuse pain in her feet/achilles areas, right elbow, both wrists, hands and wrist, she has pain in her trap area and low neck as well as low back.  Pain is increased with inactivity or overactivity. It's harder to move when she first awakens in the morning. If she drives in the car more than 45mnutes she'll start to hurt.   She has used advil, tylenol without benefit. She has been on diclofenac for about a year 770mbid. It lowers her pain from 6-7/10 to 2-3 out of 10. She also has been placed on cymbalta for her mood. It didn't help much with her pain however. She has used lidocaine patches, CBD lotion,icy hot patches. She is currently on flexeril for a stiff neck.    She walks about a mile daily for exercise.  I asked her if she has gotten into the pool for exercise and she says she has and finds the pool the  best environment for her.  Her sleep is poor due to pain. She finds it difficult to find a comfortable position. She maybe gets about 4-5 hours per night, but even that sleep is poor. She sometimes will use a tylenol PM.   Tiffany Cunningham like a diagnosis even though diclofenac has helped her pain.   Pain Inventory Average Pain 3 Pain Right Now 2 My pain is constant, burning, dull, and aching  In the last 24 hours, has pain interfered with the following? General activity 4 Relation with others 4 Enjoyment of life 4 What TIME of day is your pain at its worst? varies Sleep (in general) Poor  Pain is worse with: walking, sitting, inactivity, standing, and some activites Pain improves with: therapy/exercise and medication Relief from Meds: 4  walk without assistance how many minutes can you walk? 60 ability to climb steps?  yes do you drive?  yes  not employed: date last employed 09/2007 I need assistance with the following:  meal prep and household duties  No problems in this area  New pt  New pt    Family History  Problem Relation Age of Onset   Allergic rhinitis Mother    Cancer Father    Clotting disorder Father    Allergic rhinitis Sister    Asthma Sister    Eczema Daughter    Asthma Cousin  Urticaria Neg Hx    Immunodeficiency Neg Hx    Social History   Socioeconomic History   Marital status: Married    Spouse name: Not on file   Number of children: Not on file   Years of education: Not on file   Highest education level: Not on file  Occupational History   Not on file  Tobacco Use   Smoking status: Never   Smokeless tobacco: Never  Vaping Use   Vaping Use: Never used  Substance and Sexual Activity   Alcohol use: No    Alcohol/week: 0.0 standard drinks of alcohol   Drug use: No   Sexual activity: Not on file  Other Topics Concern   Not on file  Social History Narrative   Not on file   Social Determinants of Health   Financial Resource  Strain: Not on file  Food Insecurity: Not on file  Transportation Needs: Not on file  Physical Activity: Not on file  Stress: Not on file  Social Connections: Not on file   Past Surgical History:  Procedure Laterality Date   APPENDECTOMY  02/05/1977   CHOLECYSTECTOMY N/A 07/01/2021   Procedure: LAPAROSCOPIC CHOLECYSTECTOMY WITH INTRAOPERATIVE CHOLANGIOGRAM;  Surgeon: Stark Klein, MD;  Location: [facility];  Service: General;  Laterality: N/A;   HIP SURGERY Left    OSTEOTOMY  02/06/1991   SHOULDER SURGERY Left    Past Medical History:  Diagnosis Date   Aortic atherosclerosis ([place]) 06/30/2021   Mitral valve prolapse    Question of MVP in 2017 though looks like they didnt see this on repeat echo according to cards 2021 office note.   Urticaria    BP 133/76   Pulse 79   Ht 5' 4"$  (1.626 m)   Wt 161 lb 3.2 oz (73.1 kg)   LMP  (LMP Unknown)   SpO2 94%   BMI 27.67 kg/m   Opioid Risk Score:   Fall Risk Score:  `1  Depression screen PHQ 2/9     03/28/2022    1:40 PM  Depression screen PHQ 2/9  Decreased Interest 0  Down, Depressed, Hopeless 0  PHQ - 2 Score 0  Altered sleeping 3  Tired, decreased energy 2  Feeling bad or failure about yourself  0  Trouble concentrating 0  Moving slowly or fidgety/restless 0  Suicidal thoughts 0  PHQ-9 Score 5  Difficult doing work/chores Somewhat difficult      Review of Systems  Musculoskeletal:  Positive for back pain and neck pain.       Right elbow pain Bilateral tricep pain  All other systems reviewed and are negative.     Objective:   Physical Exam Gen: no distress, normal appearing HEENT: oral mucosa pink and moist, NCAT Cardio: Reg rate Chest: normal effort, normal rate of breathing Abd: soft, non-distended Ext: no edema Psych: pleasant, normal affect Skin: intact Neuro: Alert and oriented x 3. Normal insight and awareness. Intact Memory. Normal language and speech. Cranial nerve exam unremarkable. Motor 5/5 in all 4  limbs. Sensory exam normal for light touch and pain in all 4 limbs. No limb ataxia or cerebellar signs. No abnormal tone appreciated.   Musculoskeletal: Patient with reasonable posture.  She may be a bit head forward.  Trapezius muscles were significantly tight right more than left with palpation.  She also had some tightness in her neck with lateral rotation right greater than left.  Forward flexion seem to be somewhat unrestricted.  Sidebending was a bit tighter on  the right.  She had reasonable range of motion of both elbows and shoulders.  There were mild rotator cuff impingement signs at both shoulders today noted.  There was some difficulty with pinch right more than left with pain at the carpometacarpal joint.  Mild knee discomfort with range of motion.  Did not appreciate focal crepitus.  She has multiple tender areas throughout the body including her traps, lower neck, right elbow, both wrist and hands, both greater trochanters, lumbar spine that the waist belt level, both knees as well as both lateral and medial malleoli.  I saw no gross joint deformities today on examination.  No joint warmth was appreciated either.       Assessment & Plan:  Chronic pain syndrome most consistent with fibromyalgia.  We discussed the fact that she has some of the telltale signs of fibromyalgia including insomnia, depression, multiple wide-spread areas with pain, myofascial dysfunction, and even headaches in the past.  We talked about the fact that even though we may call this fibromyalgia she has become sensitized to her chronic pain by the near longevity of the symptoms.  She waited over 20 years to address some of these areas that were causing her problems, and in the meantime she developed this widespread pain sensitivity syndrome. 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  She has had numerous surgeries involving the left shoulder hip and knee over the last few  years    Plan: Agree with Cymbalta for mood. If nothing else, it helps her depression. Discussed the importance of maintaining aerobic exercise.  She said initially that she feels better when she stays active.  I encouraged her to seek out aquatic based activities.  [facility] [employer] has several classes such as water yoga, water aerobics and fibromyalgia classes in the water. Consider course of therapy at integrative who specializes in fibromyalgia treatments amongst other diagnoses Begin trial of tizanidine to help reestablish sleep cycle as well as to help with her nighttime pain.  Begin 2 mg at night for 3 to 4 days then increase to 4 mg thereafter In about 1 week begin trial of gabapentin 100 mg twice daily titrating after 3 to 4 days to 3 times daily, with the idea here of treating her generalized pain. Continue diclofenac 75 mg p.o. twice daily Discussed supplements some of which she is already taking including fish oil, vitamin D, magnesium.  She is not taking coenzyme Q 10 which I also recommended today  About 50 minutes of time was spent today with the patient in discussion and assessment of her pain and coming up with a custom treatment plan.  I will see her back in about 2 months time.

## 2022-04-11 ENCOUNTER — Encounter: Payer: No Typology Code available for payment source | Admitting: Physical Medicine & Rehabilitation

## 2022-05-30 ENCOUNTER — Encounter
Payer: No Typology Code available for payment source | Attending: Physical Medicine & Rehabilitation | Admitting: Physical Medicine & Rehabilitation

## 2022-05-30 ENCOUNTER — Encounter: Payer: Self-pay | Admitting: Physical Medicine & Rehabilitation

## 2022-05-30 DIAGNOSIS — M797 Fibromyalgia: Secondary | ICD-10-CM | POA: Diagnosis not present

## 2022-05-30 MED ORDER — TIZANIDINE HCL 4 MG PO TABS: 4.0000 mg | ORAL_TABLET | Freq: Every day | ORAL | 3 refills | Status: DC

## 2022-05-30 MED ORDER — GABAPENTIN 300 MG PO CAPS: 300.0000 mg | ORAL_CAPSULE | Freq: Three times a day (TID) | ORAL | 3 refills | Status: DC

## 2022-05-30 NOTE — Patient Instructions (Addendum)
GABAPENTIN: WEEK 1:  AM,  PM AND  BEDTIME WEEK 2: 300-100-300 WEEK 3+:  THRE X DAILY  YOU CAN TAKE 4-6 MG TIZANIDINE AT BEDTIME.   AQUATIC ACTIVITY IF POSSIBLE OR AT LEAST SOME TYPE OF REGULAR AEROBIC EXERCISE.    SUPPLEMENTS USEFUL FOR OSTEOARTHRITIS: OMEGA 3 FATTY ACIDS, TURMERIC, GINGER, TART CHERRY EXTRACT, CELERY SEED, GLUCOSAMINE WITH CHONDROITIN

## 2022-05-30 NOTE — Progress Notes (Signed)
Subjective:    Patient ID: Tiffany Cunningham, female    DOB: [birth year], 67 y.o.   MRN: [id number]  HPI  Tiffany Cunningham is here in follow up of her chronic pain syndrome.  At last saw her just over a month ago for initial evaluation.  We started her on some low-dose tizanidine at bedtime which seems to help her fall asleep.  She still will wake up with significant pain in bed and heart when she has to turn over.  However she is able to fall asleep more quickly.  Her mood is about the same as it has been with the Cymbalta dose at 60 mg daily.  We started her on gabapentin 100 mg 3 times daily without significant benefit either.  She has not had any tolerance issues with it, however.  She does find the diclofenac helps 75 mg p.o. twice daily.  She is taking that with food on her stomach in general.  She also is taking some other supplements that we discussed including coenzyme every 10, omega-3 fatty acids etc.  She has not found her way to the gym just yet.  She has been intending to try to start some aquatic based activities.     Pain Inventory Average Pain 3 Pain Right Now 3 My pain is constant, burning, dull, stabbing, and aching  In the last 24 hours, has pain interfered with the following? General activity 5 Relation with others 4 Enjoyment of life 5 What TIME of day is your pain at its worst? night Sleep (in general) Poor  Pain is worse with: sitting, inactivity, standing, and some activites Pain improves with: heat/ice, therapy/exercise, and medication Relief from Meds: 0  Family History  Problem Relation Age of Onset   Allergic rhinitis Mother    Cancer Father    Clotting disorder Father    Allergic rhinitis Sister    Asthma Sister    Eczema Daughter    Asthma Cousin    Urticaria Neg Hx    Immunodeficiency Neg Hx    Social History   Socioeconomic History   Marital status: Married    Spouse name: Not on file   Number of children: Not on file   Years of education: Not on file    Highest education level: Not on file  Occupational History   Not on file  Tobacco Use   Smoking status: Never   Smokeless tobacco: Never  Vaping Use   Vaping Use: Never used  Substance and Sexual Activity   Alcohol use: No    Alcohol/week: 0.0 standard drinks of alcohol   Drug use: No   Sexual activity: Not on file  Other Topics Concern   Not on file  Social History Narrative   Not on file   Social Determinants of Health   Financial Resource Strain: Not on file  Food Insecurity: Not on file  Transportation Needs: Not on file  Physical Activity: Not on file  Stress: Not on file  Social Connections: Not on file   Past Surgical History:  Procedure Laterality Date   APPENDECTOMY  02/05/1977   CHOLECYSTECTOMY N/A 07/01/2021   Procedure: LAPAROSCOPIC CHOLECYSTECTOMY WITH INTRAOPERATIVE CHOLANGIOGRAM;  Surgeon: Almond Lint, MD;  Location: [facility];  Service: General;  Laterality: N/A;   HIP SURGERY Left    OSTEOTOMY  02/06/1991   SHOULDER SURGERY Left    Past Surgical History:  Procedure Laterality Date   APPENDECTOMY  02/05/1977   CHOLECYSTECTOMY N/A 07/01/2021   Procedure: LAPAROSCOPIC CHOLECYSTECTOMY  WITH INTRAOPERATIVE CHOLANGIOGRAM;  Surgeon: Almond Lint, MD;  Location: [facility];  Service: General;  Laterality: N/A;   HIP SURGERY Left    OSTEOTOMY  02/06/1991   SHOULDER SURGERY Left    Past Medical History:  Diagnosis Date   Aortic atherosclerosis (HCC) 06/30/2021   Mitral valve prolapse    Question of MVP in 2017 though looks like they didnt see this on repeat echo according to cards 2021 office note.   Urticaria    BP 122/73   Pulse 85   Ht  (1.626 m)   Wt 166 lb (75.3 kg)   SpO2 97%   BMI 28.49 kg/m   Opioid Risk Score:   Fall Risk Score:  `1  Depression screen PHQ 2/9     03/28/2022    1:40 PM  Depression screen PHQ 2/9  Decreased Interest 0  Down, Depressed, Hopeless 0  PHQ - 2 Score 0  Altered sleeping 3  Tired, decreased energy 2   Feeling bad or failure about yourself  0  Trouble concentrating 0  Moving slowly or fidgety/restless 0  Suicidal thoughts 0  PHQ-9 Score 5  Difficult doing work/chores Somewhat difficult      Review of Systems  Musculoskeletal:  Positive for back pain and neck pain.       Bilateral hip pain Bilateral thumb pain Bilateral elbow pain  All other systems reviewed and are negative.     Objective:   Physical Exam General: No acute distress HEENT: NCAT, EOMI, oral membranes moist Cards: reg rate  Chest: normal effort Abdomen: Soft, NT, ND Skin: dry, intact Extremities: no edema Psych: pleasant and appropriate  Skin: intact Neuro: Sensory exam normal for light touch and pain in all 4 limbs. No limb ataxia or cerebellar signs. No abnormal tone appreciated.    Musculoskeletal: Patient with reasonable posture once again.  I did not palpate her trapezius muscles today.  She ambulated with a normal gait pattern.  Mild tenderness with weightbearing noted.  No gross joint deformities were appreciated.              Assessment & Plan:  Chronic pain syndrome most consistent with fibromyalgia.    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  She has had numerous surgeries involving the left shoulder hip and knee over the last few years       Plan: Continue with Cymbalta 60 mg daily.  Consider further titration for pain control Encouraged aerobic activity as well as aquatic exercises above.  I think yoga would be a good fit for her from a standpoint of her laxation and range of motion.  Still can consider course of therapy at [facility] who specializes in fibromyalgia treatments amongst other diagnoses Increase tizanidine to 4-6 mg at night.  Titrate gabapentin up to 300 mg 3 times daily over a 2-week period. Continue diclofenac 75 mg p.o. twice daily.  He is to make sure she takes it with food Discussed supplements some of which she is already  taking including fish oil, vitamin D, magnesium.  Continue coenzyme Q 10.  Also recommended turmeric and tart cherry extract for joint pain   About 20+ minutes of time was spent today with the patient in discussion and assessment of her pain and coming up with a custom treatment plan.  I will see her back in about 2 months time.

## 2022-06-19 ENCOUNTER — Other Ambulatory Visit: Payer: Self-pay | Admitting: Physical Medicine & Rehabilitation

## 2022-06-19 DIAGNOSIS — M797 Fibromyalgia: Secondary | ICD-10-CM

## 2022-06-25 ENCOUNTER — Encounter: Payer: Self-pay | Admitting: Cardiology

## 2022-06-25 ENCOUNTER — Ambulatory Visit: Payer: No Typology Code available for payment source | Attending: Cardiology | Admitting: Cardiology

## 2022-06-25 VITALS — BP 120/60 | HR 71 | Ht 64.0 in | Wt 165.0 lb

## 2022-06-25 DIAGNOSIS — I10 Essential (primary) hypertension: Secondary | ICD-10-CM

## 2022-06-25 DIAGNOSIS — R002 Palpitations: Secondary | ICD-10-CM

## 2022-06-25 DIAGNOSIS — I700 Atherosclerosis of aorta: Secondary | ICD-10-CM | POA: Diagnosis not present

## 2022-06-25 NOTE — Progress Notes (Signed)
Cardiology Office Note:    Date:  06/25/2022   ID:  Tiffany Cunningham, DOB [birth year], MRN [id number]  PCP:  Ladora Daniel, PA-C  [facility] Cardiologist:  Donato Schultz, MD  [facility] Electrophysiologist:  None   Referring MD: Ladora Daniel, PA-C    History of Present Illness:    ALAIJA Cunningham is a 67 y.o. female here for 2-year follow-up of palpitations, hypertension prior heart murmur with echocardiogram showing no obvious valvular abnormalities however vigorous contraction with mild focal basal septal hypertrophy noted.    Previously she was described as having mitral valve prolapse but this was not seen was on recent echocardiogram.  Prior palpitations were likely PVCs or PACs and conservative management strategy was needed.  Leg cramps were also improved with magnesium.  At prior visit she had had a 20 pound weight loss.  Cymbalta had helped her mood.  Blood pressure was low.    Overall feels well.  Discussed aortic atherosclerosis with her.  See below.  No high risk symptoms of syncope or chest pain.  Continue to monitor.  If anything changes she will let us know.  Past Medical History:  Diagnosis Date   Aortic atherosclerosis (HCC) 06/30/2021   Mitral valve prolapse    Question of MVP in 2017 though looks like they didnt see this on repeat echo according to cards 2021 office note.   Urticaria     Past Surgical History:  Procedure Laterality Date   APPENDECTOMY  02/05/1977   CHOLECYSTECTOMY N/A 07/01/2021   Procedure: LAPAROSCOPIC CHOLECYSTECTOMY WITH INTRAOPERATIVE CHOLANGIOGRAM;  Surgeon: Almond Lint, MD;  Location: [facility];  Service: General;  Laterality: N/A;   HIP SURGERY Left    OSTEOTOMY  02/06/1991   SHOULDER SURGERY Left     Current Medications: Current Meds  Medication Sig   amLODipine (NORVASC) 5 MG tablet Take 5 mg by mouth daily.   Azelastine-Fluticasone 137-50 MCG/ACT SUSP Place 1 spray into the nose 2 (two) times daily as needed. (Patient taking  differently: Place 1 spray into the nose 2 (two) times daily as needed (allergies).)   Cholecalciferol (VITAMIN D3) 25 MCG (1000 UT) capsule Take 1,000 Units by mouth daily. softgels   diclofenac (VOLTAREN) 75 MG EC tablet Take 75 mg by mouth 2 (two) times daily.   DULoxetine (CYMBALTA) 60 MG capsule Take 60 mg by mouth daily.   gabapentin (NEURONTIN) 300 MG capsule Take 1 capsule (300 mg total) by mouth 3 (three) times daily.   losartan-hydrochlorothiazide (HYZAAR) 100-25 MG tablet Take 1 tablet by mouth daily.   Magnesium Oxide 250 MG TABS Take 250 mg by mouth at bedtime.   Multiple Vitamin (MULTIVITAMIN WITH MINERALS) TABS tablet Take 1 tablet by mouth daily.   Omega-3 Fatty Acids (FISH OIL) 1000 MG CAPS Take 3 capsules by mouth daily.   Polyethyl Glycol-Propyl Glycol (SYSTANE OP) Place 1 drop into both eyes in the morning and at bedtime.   Potassium 99 MG TABS daily at 6 (six) AM.   Potassium Gluconate 550 (90 K) MG TABS Take 1 tablet by mouth daily.   tiZANidine (ZANAFLEX) 4 MG tablet Take 1-1.5 tablets (4-6 mg total) by mouth at bedtime.     Allergies:   Ace inhibitors   Social History   Socioeconomic History   Marital status: Married    Spouse name: Not on file   Number of children: Not on file   Years of education: Not on file   Highest education level: Not on file  Occupational History   Not on file  Tobacco Use   Smoking status: Never   Smokeless tobacco: Never  Vaping Use   Vaping Use: Never used  Substance and Sexual Activity   Alcohol use: No    Alcohol/week: 0.0 standard drinks of alcohol   Drug use: No   Sexual activity: Not on file  Other Topics Concern   Not on file  Social History Narrative   Not on file   Social Determinants of Health   Financial Resource Strain: Not on file  Food Insecurity: Not on file  Transportation Needs: Not on file  Physical Activity: Not on file  Stress: Not on file  Social Connections: Not on file     Family  History: The patient's family history includes Allergic rhinitis in her mother and sister; Asthma in her cousin and sister; Cancer in her father; Clotting disorder in her father; Eczema in her daughter. There is no history of Urticaria or Immunodeficiency.  ROS:   Please see the history of present illness.    Denies any fevers chills nausea vomiting syncope bleeding all other systems reviewed and are negative.  EKGs/Labs/Other Studies Reviewed:    The following studies were reviewed today:  Cardiac Studies & Procedures       ECHOCARDIOGRAM  ECHOCARDIOGRAM COMPLETE 04/01/2015  Narrative *[facility]* [address] [place], [place] [address] [phone]  ------------------------------------------------------------------- Transthoracic Echocardiography  Patient:    Tiffany Cunningham MR #:       [id number] Study Date: 04/01/2015 Gender:     F Age:        60 Height:     161.3 cm Weight:     78.1 kg BSA:        1.9 m^2 Pt. Status: Room:  SONOGRAPHER  Vergia Alcon Althea Grimmer, M.D. PERFORMING   [facility], Outpatient ATTENDING    Chilton Si, MD  cc:  ------------------------------------------------------------------- LV EF: 65% -   70%  ------------------------------------------------------------------- Indications:      (I34.1).  ------------------------------------------------------------------- History:   PMH:  Acquired from the patient and from the patient&'s chart.  Mitral valve disease.  Risk factors:  Hypertension.  ------------------------------------------------------------------- Study Conclusions  - Left ventricle: The cavity size was normal. There was mild focal basal hypertrophy of the septum. Systolic function was vigorous. The estimated ejection fraction was in the range of 65% to 70%. Wall motion was normal; there were no regional wall motion abnormalities. Doppler parameters are consistent with abnormal left ventricular  relaxation (grade 1 diastolic dysfunction). Doppler parameters are consistent with high ventricular filling pressure. - Aortic valve: Transvalvular velocity was within the normal range. There was no stenosis. There was no regurgitation. - Mitral valve: Transvalvular velocity was within the normal range. There was no evidence for stenosis. There was no regurgitation. - Left atrium: The atrium was mildly dilated. - Right ventricle: The cavity size was normal. Wall thickness was normal. Systolic function was normal. - Tricuspid valve: There was trivial regurgitation. - Pulmonary arteries: Systolic pressure was within the normal range. PA peak pressure: 31 mm Hg (S). - Inferior vena cava: The vessel was normal in size. - Pericardium, extracardiac: A small pericardial effusion was identified. Features were not consistent with tamponade physiology.  Transthoracic echocardiography.  M-mode, complete 2D, spectral Doppler, and color Doppler.  Birthdate:  Patient birthdate: 1955-08-29.  Age:  Patient is 67 yr old.  Sex:  Gender: female. BMI: 30 kg/m^2.  Blood pressure:  138/82  Patient status: Outpatient.  Study date:  Study date: 04/01/2015. Study time: 01:13 PM.  Location:  [facility]  -------------------------------------------------------------------  ------------------------------------------------------------------- Left ventricle:  The cavity size was normal. There was mild focal basal hypertrophy of the septum. Systolic function was vigorous. The estimated ejection fraction was in the range of 65% to 70%. Wall motion was normal; there were no regional wall motion abnormalities. Doppler parameters are consistent with abnormal left ventricular relaxation (grade 1 diastolic dysfunction). Doppler parameters are consistent with high ventricular filling pressure.  ------------------------------------------------------------------- Aortic valve:  Poorly visualized.  Trileaflet;  normal thickness leaflets. Mobility was not restricted.  Doppler:  Transvalvular velocity was within the normal range. There was no stenosis. There was no regurgitation.  ------------------------------------------------------------------- Aorta:  Aortic root: The aortic root was normal in size.  ------------------------------------------------------------------- Mitral valve:   Structurally normal valve.   Mobility was not restricted.  Doppler:  Transvalvular velocity was within the normal range. There was no evidence for stenosis. There was no regurgitation.    Peak gradient (D): 3 mm Hg.  ------------------------------------------------------------------- Left atrium:  The atrium was mildly dilated.  ------------------------------------------------------------------- Right ventricle:  The cavity size was normal. Wall thickness was normal. Systolic function was normal.  ------------------------------------------------------------------- Pulmonic valve:    Structurally normal valve.   Cusp separation was normal.  Doppler:  Transvalvular velocity was within the normal range. There was no evidence for stenosis. There was no regurgitation.  ------------------------------------------------------------------- Tricuspid valve:   Structurally normal valve.    Doppler: Transvalvular velocity was within the normal range. There was trivial regurgitation.  ------------------------------------------------------------------- Pulmonary artery:   The main pulmonary artery was normal-sized. Systolic pressure was within the normal range.  ------------------------------------------------------------------- Right atrium:  The atrium was normal in size.  ------------------------------------------------------------------- Pericardium:  A small pericardial effusion was identified. Doppler:  Features were not consistent with tamponade  physiology.  ------------------------------------------------------------------- Systemic veins: Inferior vena cava: The vessel was normal in size.  ------------------------------------------------------------------- Measurements  Left ventricle                           Value        Reference LV ID, ED, PLAX chordal          (L)     40.2  mm     43 - 52 LV ID, ES, PLAX chordal                  25.6  mm     23 - 38 LV fx shortening, PLAX chordal           36    %      >=29 LV PW thickness, ED                      9.94  mm     --------- IVS/LV PW ratio, ED                      1.11         <=1.3 Stroke volume, 2D                        76    ml     --------- Stroke volume/bsa, 2D                    40    ml/m^2 --------- LV ejection fraction, 1-p  A4C            66    %      --------- LV end-diastolic volume, 2-p             60    ml     --------- LV end-systolic volume, 2-p              18    ml     --------- LV ejection fraction, 2-p                70    %      --------- Stroke volume, 2-p                       42    ml     --------- LV end-diastolic volume/bsa, 2-p         32    ml/m^2 --------- LV end-systolic volume/bsa, 2-p          9     ml/m^2 --------- Stroke volume/bsa, 2-p                   22.2  ml/m^2 --------- LV e&', lateral                           5.26  cm/s   --------- LV E/e&', lateral                         15.38        --------- LV e&', medial                            5.46  cm/s   --------- LV E/e&', medial                          14.82        --------- LV e&', average                           5.36  cm/s   --------- LV E/e&', average                         15.09        ---------  Ventricular septum                       Value        Reference IVS thickness, ED                        11    mm     ---------  LVOT                                     Value        Reference LVOT ID, S                               20    mm     --------- LVOT area  3.14  cm^2   --------- LVOT ID                                  20    mm     --------- LVOT peak velocity, S                    117   cm/s   --------- LVOT mean velocity, S                    76.6  cm/s   --------- LVOT VTI, S                              24.3  cm     --------- LVOT peak gradient, S                    5     mm Hg  --------- Stroke volume (SV), LVOT DP              76.3  ml     --------- Stroke index (SV/bsa), LVOT DP           40.3  ml/m^2 ---------  Aorta                                    Value        Reference Aortic root ID, ED                       31    mm     --------- Ascending aorta ID, A-P, S               28    mm     ---------  Left atrium                              Value        Reference LA ID, A-P, ES                           43    mm     --------- LA ID/bsa, A-P                   (H)     2.27  cm/m^2 <=2.2 LA volume, S                             49    ml     --------- LA volume/bsa, S                         25.8  ml/m^2 --------- LA volume, ES, 1-p A4C                   57    ml     --------- LA volume/bsa, ES, 1-p A4C               30.1  ml/m^2 --------- LA volume, ES, 1-p A2C  39    ml     --------- LA volume/bsa, ES, 1-p A2C               20.6  ml/m^2 ---------  Mitral valve                             Value        Reference Mitral E-wave peak velocity              80.9  cm/s   --------- Mitral A-wave peak velocity              93.3  cm/s   --------- Mitral deceleration time                 229   ms     150 - 230 Mitral peak gradient, D                  3     mm Hg  --------- Mitral E/A ratio, peak                   0.9          ---------  Pulmonary arteries                       Value        Reference PA pressure, S, DP               (H)     31    mm Hg  <=30  Tricuspid valve                          Value        Reference Tricuspid regurg peak velocity           265   cm/s   --------- Tricuspid peak RV-RA  gradient            28    mm Hg  ---------  Systemic veins                           Value        Reference Estimated CVP                            3     mm Hg  ---------  Right ventricle                          Value        Reference RV pressure, S, DP               (H)     31    mm Hg  <=30 RV s&', lateral, S                        18    cm/s   ---------  Legend: (L)  and  (H)  Terrell Ostrand values outside specified reference range.  ------------------------------------------------------------------- Prepared and Electronically Authenticated by  Chilton Si, MD 2017-02-24T19:13:06               EKG: 06/25/2022-normal sinus rhythm  prior sinus rhythm 71 no other changes.  Recent Labs: 07/01/2021: Hemoglobin 10.7; Platelets 209 07/02/2021: ALT  271; BUN 13; Creatinine, Ser 0.53; Potassium 3.8; Sodium 138  Recent Lipid Panel    Component Value Date/Time   CHOL 238 (H) 06/29/2021 2231   TRIG 63 06/30/2021 1004   HDL 74 06/29/2021 2231   CHOLHDL 3.2 06/29/2021 2231   VLDL 19 06/29/2021 2231   LDLCALC 145 (H) 06/29/2021 2231     Risk Assessment/Calculations:       Physical Exam:    VS:  BP 120/60   Pulse 71   Ht 5\' 4"  (1.626 m)   Wt 165 lb (74.8 kg)   SpO2 96%   BMI 28.32 kg/m     Wt Readings from Last 3 Encounters:  06/25/22 165 lb (74.8 kg)  05/30/22 166 lb (75.3 kg)  03/28/22 161 lb 3.2 oz (73.1 kg)     GEN:  Well nourished, well developed in no acute distress HEENT: Normal NECK: No JVD; No carotid bruits LYMPHATICS: No lymphadenopathy CARDIAC: RRR, 1/6 systolic murmur, no rubs, gallops RESPIRATORY:  Clear to auscultation without rales, wheezing or rhonchi  ABDOMEN: Soft, non-tender, non-distended MUSCULOSKELETAL:  No edema; No deformity  SKIN: Warm and dry NEUROLOGIC:  Alert and oriented x 3 PSYCHIATRIC:  Normal affect   ASSESSMENT:    1. Aortic atherosclerosis (HCC)   2. Essential hypertension   3. Palpitations     PLAN:    In order of  problems listed above:  Palpitations -Conservative management strategy likely PVCs and PACs.  Try to avoid stimulants, excessive caffeine.  Doing well.  Mitral valve prolapse -No significant prolapse was seen on last echocardiogram in 2017.  Murmur was heard and likely secondary to accelerated blood flow through outflow tract.  Essential hypertension - At prior visit we had stopped her amlodipine 5 mg because of hypotension.  She was also taking losartan hydrochlorothiazide combination.  She had also lost 20 pounds before prior visit.  Cymbalta.  Her joint pain seems to be improving.  Sees Dr. Hermelinda Medicus for fibromyalgia.  Continue medical management.  Continue with good optimal control.  Leg cramps -Magnesium 400 mg tablet daily.  Aortic atherosclerosis -We discussed addition of Crestor today.  She would like to discuss this with her primary care physician.  Aortic atherosclerosis was noted on her abdominal CT prior to her cholecystectomy.  Her LDL cholesterol is 145.  Optimally, I would like for this to be less than 70.  In addition to lifestyle modifications more fruits and vegetables diet and exercise, I think addition of Crestor 10 mg once a day would be very helpful for her.  She would like to wait and discuss this further.  2-year follow-up.   Medication Adjustments/Labs and Tests Ordered: Current medicines are reviewed at length with the patient today.  Concerns regarding medicines are outlined above.  Orders Placed This Encounter  Procedures   EKG 12-Lead   No orders of the defined types were placed in this encounter.   Patient Instructions  Medication Instructions:  The current medical regimen is effective;  continue present plan and medications.  *If you need a refill on your cardiac medications before your next appointment, please call your pharmacy*  Follow-Up: At [facility], you and your health needs are our priority.  As part of our continuing mission to  provide you with exceptional heart care, we have created designated Provider Care Teams.  These Care Teams include your primary Cardiologist (physician) and Advanced Practice Providers (APPs -  Physician Assistants and Nurse Practitioners) who all work together to provide you  with the care you need, when you need it.  We recommend signing up for the patient portal called "MyChart".  Sign up information is provided on this After Visit Summary.  MyChart is used to connect with patients for Virtual Visits (Telemedicine).  Patients are able to view lab/test results, encounter notes, upcoming appointments, etc.  Non-urgent messages can be sent to your provider as well.   To learn more about what you can do with MyChart, go to [email or web address].    Your next appointment:   2 year(s)  Provider:   Donato Schultz, MD        Signed, Donato Schultz, MD  06/25/2022 9:06 AM    [facility]

## 2022-06-25 NOTE — Patient Instructions (Signed)
Medication Instructions:  The current medical regimen is effective;  continue present plan and medications.  *If you need a refill on your cardiac medications before your next appointment, please call your pharmacy*  Follow-Up: At [facility], you and your health needs are our priority.  As part of our continuing mission to provide you with exceptional heart care, we have created designated Provider Care Teams.  These Care Teams include your primary Cardiologist (physician) and Advanced Practice Providers (APPs -  Physician Assistants and Nurse Practitioners) who all work together to provide you with the care you need, when you need it.  We recommend signing up for the patient portal called "MyChart".  Sign up information is provided on this After Visit Summary.  MyChart is used to connect with patients for Virtual Visits (Telemedicine).  Patients are able to view lab/test results, encounter notes, upcoming appointments, etc.  Non-urgent messages can be sent to your provider as well.   To learn more about what you can do with MyChart, go to [email or web address].    Your next appointment:   2 year(s)  Provider:   Mark Skains, MD     

## 2022-07-25 ENCOUNTER — Encounter
Payer: No Typology Code available for payment source | Attending: Physical Medicine & Rehabilitation | Admitting: Physical Medicine & Rehabilitation

## 2022-07-25 ENCOUNTER — Encounter: Payer: Self-pay | Admitting: Physical Medicine & Rehabilitation

## 2022-07-25 VITALS — BP 159/77 | HR 74 | Ht 64.0 in | Wt 165.4 lb

## 2022-07-25 DIAGNOSIS — M797 Fibromyalgia: Secondary | ICD-10-CM | POA: Diagnosis present

## 2022-07-25 DIAGNOSIS — M130 Polyarthritis, unspecified: Secondary | ICD-10-CM | POA: Diagnosis present

## 2022-07-25 DIAGNOSIS — M7918 Myalgia, other site: Secondary | ICD-10-CM | POA: Insufficient documentation

## 2022-07-25 MED ORDER — DULOXETINE HCL 30 MG PO CPEP: 30.0000 mg | ORAL_CAPSULE | Freq: Every day | ORAL | 2 refills | Status: DC

## 2022-07-25 NOTE — Patient Instructions (Signed)
ALWAYS FEEL FREE TO CALL OUR OFFICE WITH ANY PROBLEMS OR QUESTIONS ([phone])  **PLEASE NOTE** ALL MEDICATION REFILL REQUESTS (INCLUDING CONTROLLED SUBSTANCES) NEED TO BE MADE AT LEAST 7 DAYS PRIOR TO REFILL BEING DUE. ANY REFILL REQUESTS INSIDE THAT TIME FRAME MAY RESULT IN DELAYS IN RECEIVING YOUR PRESCRIPTION.                    

## 2022-07-25 NOTE — Progress Notes (Signed)
Subjective:    Patient ID: Tiffany Cunningham, female    DOB: [birth year], 67 y.o.   MRN: [id number]  HPI  Mrs. Selner is here in follow-up of her chronic pain.  She reports that her pain has increased quite a bit since last visit.  She stopped taking the diclofenac except for on a as needed basis.  As result her pain is much greater.  She did not notice much of a change with the increase in gabapentin.  She is using magnesium supplement with some benefit but not coenzyme Q10 or tumeric.  The more she is active the more she hurts.  Complains of pain from her neck to shoulders elbows wrist and hands hips knees ankles and feet.  She recently was on a trip with her family and spent time with her grandchildren and it really was a lot for her.  She did use the diclofenac which helped her get through things a bit better.  Her husband is with her today to support some of what she is saying and to describe what kind of pain that she is in.  Mrs. Bertholf remains on Cymbalta as she has been on for some time.  She never has felt that it his helped with her pain control.  She states that she does not feel depressed but can become tearful and upset when her pain levels are high.  Her husband does help with massages for her neck and shoulder areas which do seem to help.   Pain Inventory Average Pain 8 Pain Right Now 8 My pain is burning, stabbing, and aching  In the last 24 hours, has pain interfered with the following? General activity 7 Relation with others 7 Enjoyment of life 7 What TIME of day is your pain at its worst? morning , daytime, evening, and night Sleep (in general) Fair  Pain is worse with: walking, bending, sitting, inactivity, standing, and some activites Pain improves with: medication Relief from Meds: 5  Family History  Problem Relation Age of Onset   Allergic rhinitis Mother    Cancer Father    Clotting disorder Father    Allergic rhinitis Sister    Asthma Sister    Eczema  Daughter    Asthma Cousin    Urticaria Neg Hx    Immunodeficiency Neg Hx    Social History   Socioeconomic History   Marital status: Married    Spouse name: Not on file   Number of children: Not on file   Years of education: Not on file   Highest education level: Not on file  Occupational History   Not on file  Tobacco Use   Smoking status: Never   Smokeless tobacco: Never  Vaping Use   Vaping Use: Never used  Substance and Sexual Activity   Alcohol use: No    Alcohol/week: 0.0 standard drinks of alcohol   Drug use: No   Sexual activity: Not on file  Other Topics Concern   Not on file  Social History Narrative   Not on file   Social Determinants of Health   Financial Resource Strain: Not on file  Food Insecurity: Not on file  Transportation Needs: Not on file  Physical Activity: Not on file  Stress: Not on file  Social Connections: Not on file   Past Surgical History:  Procedure Laterality Date   APPENDECTOMY  02/05/1977   CHOLECYSTECTOMY N/A 07/01/2021   Procedure: LAPAROSCOPIC CHOLECYSTECTOMY WITH INTRAOPERATIVE CHOLANGIOGRAM;  Surgeon: Almond Lint, MD;  Location: [facility];  Service: General;  Laterality: N/A;   HIP SURGERY Left    OSTEOTOMY  02/06/1991   SHOULDER SURGERY Left    Past Surgical History:  Procedure Laterality Date   APPENDECTOMY  02/05/1977   CHOLECYSTECTOMY N/A 07/01/2021   Procedure: LAPAROSCOPIC CHOLECYSTECTOMY WITH INTRAOPERATIVE CHOLANGIOGRAM;  Surgeon: Almond Lint, MD;  Location: [facility];  Service: General;  Laterality: N/A;   HIP SURGERY Left    OSTEOTOMY  02/06/1991   SHOULDER SURGERY Left    Past Medical History:  Diagnosis Date   Aortic atherosclerosis (HCC) 06/30/2021   Mitral valve prolapse    Question of MVP in 2017 though looks like they didnt see this on repeat echo according to cards 2021 office note.   Urticaria    BP (!) 159/77   Pulse 74   Ht 5\' 4"  (1.626 m)   Wt 165 lb 6.4 oz (75 kg)   SpO2 97%   BMI 28.39 kg/m    Opioid Risk Score:   Fall Risk Score:  `1  Depression screen PHQ 2/9     03/28/2022    1:40 PM  Depression screen PHQ 2/9  Decreased Interest 0  Down, Depressed, Hopeless 0  PHQ - 2 Score 0  Altered sleeping 3  Tired, decreased energy 2  Feeling bad or failure about yourself  0  Trouble concentrating 0  Moving slowly or fidgety/restless 0  Suicidal thoughts 0  PHQ-9 Score 5  Difficult doing work/chores Somewhat difficult      Review of Systems  Musculoskeletal:  Positive for back pain.       Bilateral hip pain Bilateral hand pain  All other systems reviewed and are negative.     Objective:   Physical Exam  General: No acute distress HEENT: NCAT, EOMI, oral membranes moist Cards: reg rate  Chest: normal effort Abdomen: Soft, NT, ND Skin: dry, intact Extremities: no edema Psych: pleasant and appropriate  Skin: intact Neuro: Sensory exam normal for light touch and pain in all 4 limbs. No limb ataxia or cerebellar signs. No abnormal tone appreciated.     Musculoskeletal: She has more of the head forward position today.  Trapezius muscles are very tight as well as the rhomboids and levator scapula muscles and even the sternocleidomastoids.  I really did not discern any focal trigger points she had generalized tightness and discomfort.  She was also tender along the elbows and wrists once again.  Posture in the low back was fair.  Standing balance was within normal limits.  Mild tenderness with weightbearing noted.  No gross joint deformities were appreciated.               Assessment & Plan:  Chronic pain syndrome most consistent with fibromyalgia.    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  She has had numerous surgeries involving the left shoulder hip and knee over the last few years       Plan: Increase cymbalta to 90mg  daily to better capture generalized pain.  It may help with her energy levels as well..  Will  make a referral to [facility] for holistic approach to pain dressing posterior as well as mechanics and range of motion, strength.  Apply modalities as indicated and establish home exercise program ultimately.  Continue tizanidine   4-6 mg at night.  Continue gabapentin at 300 mg 3 times daily   Resume  diclofenac 75 mg p.o. twice daily scheduled with food. Check  labs later this year.  She was advised to stop the medication if she experiences any GI upset Supplements : fish oil, vitamin D, magnesium.  Continue coenzyme Q 10 and turmeric/  -tart cherry extract for joint pain   About 20 + minutes of time was spent today with the patient in discussion and assessment of her pain and coming up with a custom treatment plan.  I will see her back in about 3 months time.

## 2022-08-26 ENCOUNTER — Other Ambulatory Visit: Payer: Self-pay | Admitting: Physical Medicine & Rehabilitation

## 2022-08-26 DIAGNOSIS — M797 Fibromyalgia: Secondary | ICD-10-CM

## 2022-10-05 ENCOUNTER — Ambulatory Visit: Payer: No Typology Code available for payment source | Attending: Physician Assistant | Admitting: Physical Therapy

## 2022-10-05 DIAGNOSIS — R2689 Other abnormalities of gait and mobility: Secondary | ICD-10-CM | POA: Insufficient documentation

## 2022-10-05 DIAGNOSIS — M6281 Muscle weakness (generalized): Secondary | ICD-10-CM | POA: Diagnosis not present

## 2022-10-05 DIAGNOSIS — M797 Fibromyalgia: Secondary | ICD-10-CM | POA: Diagnosis present

## 2022-10-05 NOTE — Therapy (Signed)
OUTPATIENT PHYSICAL THERAPY NEURO EVALUATION   Patient Name: Tiffany Cunningham MRN: [id number] DOB:1955-07-27, 67 y.o., female Today's Date: 10/05/2022   PCP: Ladora Daniel, PA-C REFERRING PROVIDER: Ladora Daniel, PA-C  END OF SESSION:  PT End of Session - 10/05/22 1233     Visit Number 1    Number of Visits 7   with eval   Date for PT Re-Evaluation 11/30/22    Authorization Type [employer]    PT Start Time 1232    PT Stop Time 1304   eval   PT Time Calculation (min) 32 min    Activity Tolerance Patient tolerated treatment well    Behavior During Therapy [facility] for tasks assessed/performed             Past Medical History:  Diagnosis Date   Aortic atherosclerosis (HCC) 06/30/2021   Mitral valve prolapse    Question of MVP in 2017 though looks like they didnt see this on repeat echo according to cards 2021 office note.   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   Myofascial pain 07/25/2022   Fibromyalgia 03/28/2022   Acute gallstone pancreatitis 06/30/2021   Pyuria 06/30/2021   Aortic atherosclerosis (HCC) 06/30/2021   Mitral valve prolapse 03/22/2015   Mitral regurgitation 03/22/2015   Palpitations 03/22/2015   Essential hypertension 03/22/2015   Seasonal and perennial allergic rhinoconjunctivitis 02/23/2015    ONSET DATE: 08/21/2022 (referral date)  REFERRING DIAG: M79.7 (ICD-10-CM) - Fibromyalgia M79.18 (ICD-10-CM) - Myalgia, other site  THERAPY DIAG:  Muscle weakness (generalized)  Other abnormalities of gait and mobility  Fibromyalgia  Rationale for Evaluation  and Treatment: Rehabilitation  SUBJECTIVE:                                                                                                                                                                                             SUBJECTIVE STATEMENT: Pt reports that her pain doctor diagnosed her with fibromyalgia a few months ago. She reports that she has been experiencing her pain for decades but within the last 6 months it has been slowly getting worse and slowly spreading throughout her body. Pt reports she has pain everywhere in her body except for her knees. Pt reports that  she has good days and bad days, she is having a good week with regards to pain this week. Pt reports when she is having a bad day she will sleep most of the day and feels like she has a fever. Pt has to reposition herself frequently at night while sleeping in attempts to get comfortable and has difficulty getting up if she has been sitting for an extended period of time.  Pt also reports that she did just finish up with PT at [facility] for her hip and her knee and they did incorporate some exercises for her fibromyalgia. Pt is still working on her HEP from previous round of therapy but reports it only keeps her pain from getting worse, does not make it better (doing clams, leg lifts, piriformis stretch--used to feel good but now it hurts). Pt interested in trying out aquatic therapy.  Pt would like to be able to romp around with her grandchildren and be able to pick up the younger ones (ages 28-12). Pt has difficulty performing ADLs and making her bed, brushing her teeth, and flossing. Pt reports in the last 6 months the pain has started to reach her wrist and her fingers.  Pt started antidepressants and antianxiety medication as well as some supplements a few months ago but has noticed no change yet in her symptoms.  Pt accompanied by: self  PERTINENT HISTORY: PMH: Aortic atherosclerosis, mitral valve prolapse,  urticaria, essential HTN, fibromylagia, osteopenia after menopause  PAIN:  Are you having pain? Yes: NPRS scale: 2/10 Pain location: whole body Pain description: ache and pains, burning in elbows Aggravating factors: activity, inactivity, lack of sleep Relieving factors: being in water  PRECAUTIONS: None  RED FLAGS: None   WEIGHT BEARING RESTRICTIONS: No  FALLS: Has patient fallen in last 6 months? No  LIVING ENVIRONMENT: Lives with: lives with their spouse Lives in: House/apartment Stairs: Yes: Internal: 14 steps; on right going up Has following equipment at home: None  PLOF: Independent with gait and Independent with transfers  PATIENT GOALS: "to improve pain"  OBJECTIVE:   DIAGNOSTIC FINDINGS: None relevant to this POC  COGNITION: Overall cognitive status: Within functional limits for tasks assessed   SENSATION: Reports no N/T in UE or LE Light touch WFL   EDEMA:  R ankle always swollen since surgery   POSTURE: rounded shoulders and forward head   LOWER EXTREMITY MMT:    MMT Right Eval Left Eval  Hip flexion 5 5  Hip extension    Hip abduction    Hip adduction    Hip internal rotation    Hip external rotation    Knee flexion 5 5  Knee extension 5 5  Ankle dorsiflexion 5 5  Ankle plantarflexion    Ankle inversion    Ankle eversion    (Blank rows = not tested)  BED MOBILITY:  Mod I per pt report  TRANSFERS: Assistive device utilized: None  Sit to stand: Modified independence Stand to sit: Modified independence Chair to chair: Modified independence Floor:  not assessed at eval   STAIRS: Level of Assistance: Modified independence Stair Negotiation Technique: Alternating Pattern  with Bilateral Rails Number of Stairs: 6  Height of Stairs: 4  Comments: needs BUE support  GAIT: Gait pattern: antalgic Distance walked: various clinic distances Assistive device utilized: None Level of assistance: Modified independence Comments: Needs  increased time  FUNCTIONAL TESTS:    [facility] PT Assessment - 10/05/22 1246       Ambulation/Gait  Gait velocity 32.8 ft over 9.69 sec = 3.38 ft/sec      Standardized Balance Assessment   Standardized Balance Assessment Timed Up and Go Test;Five Times Sit to Stand    Five times sit to stand comments  16.34 sec   no UE support     Timed Up and Go Test   TUG Normal TUG    Normal TUG (seconds) 10.15   no AD     Functional Gait  Assessment   Gait assessed  Yes    Gait Level Surface Walks 20 ft in less than 5.5 sec, no assistive devices, good speed, no evidence for imbalance, normal gait pattern, deviates no more than 6 in outside of the 12 in walkway width.    Change in Gait Speed Able to smoothly change walking speed without loss of balance or gait deviation. Deviate no more than 6 in outside of the 12 in walkway width.    Gait with Horizontal Head Turns Performs head turns smoothly with slight change in gait velocity (eg, minor disruption to smooth gait path), deviates 6-10 in outside 12 in walkway width, or uses an assistive device.    Gait with Vertical Head Turns Performs task with slight change in gait velocity (eg, minor disruption to smooth gait path), deviates 6 - 10 in outside 12 in walkway width or uses assistive device    Gait and Pivot Turn Pivot turns safely in greater than 3 sec and stops with no loss of balance, or pivot turns safely within 3 sec and stops with mild imbalance, requires small steps to catch balance.    Step Over Obstacle Is able to step over one shoe box (4.5 in total height) without changing gait speed. No evidence of imbalance.    Gait with Narrow Base of Support Is able to ambulate for 10 steps heel to toe with no staggering.    Gait with Eyes Closed Walks 20 ft, uses assistive device, slower speed, mild gait deviations, deviates 6-10 in outside 12 in walkway width. Ambulates 20 ft in less than 9 sec but greater than 7 sec.    Ambulating Backwards Walks 20 ft,  no assistive devices, good speed, no evidence for imbalance, normal gait    Steps Alternating feet, must use rail.    Total Score 24    FGA comment: 24/30   medium fall risk            PATIENT SURVEYS:  FIQR (Revised Fibromyalgia Impact Questionnaire): 27.8/100  TODAY'S TREATMENT:                                                                                                                              PT Evaluation    PATIENT EDUCATION: Education details: Eval findings, PT POC Person educated: Patient Education method: Explanation Education comprehension: verbalized understanding  HOME EXERCISE PROGRAM: To be initiated  GOALS: Goals reviewed with patient? Yes  SHORT TERM GOALS: Target date: 11/02/2022  Pt will be independent with initial aquatic HEP for improved strength, balance, transfers and gait and decreased pain. Baseline: Goal status: INITIAL    LONG TERM GOALS: Target date: 11/30/2022   Pt will be independent with final aquatic HEP for improved strength, balance, transfers and gait and decreased pain. Baseline:  Goal status: INITIAL  2.  Pt will improve 5 x STS to less than or equal to 13 seconds to demonstrate improved functional strength and transfer efficiency.  Baseline: 16.34 sec (8/30) Goal status: INITIAL  3.  Pt will improve FGA to 28/30 for decreased fall risk  Baseline: 24/30 (8/30) Goal status: INITIAL  4.  Pt will improve her score on the FIQR to 22/100 to demonstrate decreased disability level. Baseline: 27.8/100 (8/30) Goal status: INITIAL   ASSESSMENT:  CLINICAL IMPRESSION: Patient is a 67 year old female referred to Neuro OPPT for fibromyalgia.   Pt's PMH is significant for: Aortic atherosclerosis, mitral valve prolapse, urticaria, essential HTN, fibromylagia, osteopenia after menopause. The following deficits were present during the exam: increased pain, impaired balance, increased disability level based on FIQR. Pt would  benefit from skilled PT to address these impairments and functional limitations to maximize functional mobility independence.   OBJECTIVE IMPAIRMENTS: Abnormal gait, decreased activity tolerance, decreased balance, decreased knowledge of condition, and pain.   ACTIVITY LIMITATIONS: carrying, lifting, bending, sitting, standing, sleeping, stairs, transfers, and hygiene/grooming  PARTICIPATION LIMITATIONS: meal prep, cleaning, laundry, interpersonal relationship, driving, shopping, and community activity  PERSONAL FACTORS: Time since onset of injury/illness/exacerbation and 1-2 comorbidities:    Aortic atherosclerosis, mitral valve prolapse, urticaria, essential HTN, fibromylagia, osteopenia after menopauseare also affecting patient's functional outcome.   REHAB POTENTIAL: Fair time since onset, has tried PT before with no changes in symptoms (has not tried aquatic therapy before)  CLINICAL DECISION MAKING: Stable/uncomplicated  EVALUATION COMPLEXITY: Low  PLAN:  PT FREQUENCY: 1x/week  PT DURATION: 6 weeks  PLANNED INTERVENTIONS: Therapeutic exercises, Therapeutic activity, Neuromuscular re-education, Balance training, Gait training, Patient/Family education, Self Care, Joint mobilization, Stair training, Aquatic Therapy, Dry Needling, Electrical stimulation, Cryotherapy, Moist heat, Taping, Manual therapy, and Re-evaluation  PLAN FOR NEXT SESSION: initiate aquatic therapy for pain management (fibromyalgia) and work towards creating an aquatic HEP for patient    Peter Congo, PT, DPT, CSRS  10/05/2022, 1:04 PM

## 2022-10-15 ENCOUNTER — Other Ambulatory Visit: Payer: Self-pay | Admitting: Physical Medicine & Rehabilitation

## 2022-10-24 ENCOUNTER — Encounter
Payer: No Typology Code available for payment source | Attending: Physical Medicine & Rehabilitation | Admitting: Physical Medicine & Rehabilitation

## 2022-10-24 ENCOUNTER — Encounter: Payer: Self-pay | Admitting: Physical Medicine & Rehabilitation

## 2022-10-24 VITALS — BP 130/76 | HR 100 | Ht 64.0 in | Wt 169.0 lb

## 2022-10-24 DIAGNOSIS — M130 Polyarthritis, unspecified: Secondary | ICD-10-CM | POA: Insufficient documentation

## 2022-10-24 DIAGNOSIS — M797 Fibromyalgia: Secondary | ICD-10-CM | POA: Insufficient documentation

## 2022-10-24 MED ORDER — PREGABALIN 50 MG PO CAPS: 50.0000 mg | ORAL_CAPSULE | Freq: Three times a day (TID) | ORAL | 3 refills | Status: DC

## 2022-10-24 NOTE — Progress Notes (Signed)
Subjective:    Patient ID: Tiffany Cunningham, female    DOB: [birth year], 67 y.o.   MRN: [id number]  HPI  [name] is here in follow up of her chronic pain.  She says things are about the same.  She wasn't finding that therapy was very beneficial and then the therapist left. Her primary prescribed water therapy.  She had an evaluation for this and will start next week.  She continues on diclofenac as prescribed.  The increase in Cymbalta has not made a dramatic difference.  She remains on gabapentin 300 mg 3 times a day without improvement in pain either.  She uses tizanidine at bedtime.  Danajia does stay active and is walking a lot, nearly 4 miles a day.  Her husband helps her out with massage and rubs as well for focal areas of pain.  She asked about an inflammatory or rheumatological process once again based on  positive labs she has had in the past.  Pain Inventory Average Pain 4 Pain Right Now 5 My pain is constant, burning, dull, and aching  In the last 24 hours, has pain interfered with the following? General activity 5 Relation with others 1 Enjoyment of life 4 What TIME of day is your pain at its worst? morning , daytime, evening, and night Sleep (in general) Fair  Pain is worse with: walking, bending, sitting, standing, and some activites Pain improves with: therapy/exercise, pacing activities, and medication Relief from Meds: 3  Family History  Problem Relation Age of Onset   Allergic rhinitis Mother    Cancer Father    Clotting disorder Father    Allergic rhinitis Sister    Asthma Sister    Eczema Daughter    Asthma Cousin    Urticaria Neg Hx    Immunodeficiency Neg Hx    Social History   Socioeconomic History   Marital status: Married    Spouse name: Not on file   Number of children: Not on file   Years of education: Not on file   Highest education level: Not on file  Occupational History   Not on file  Tobacco Use   Smoking status: Never   Smokeless tobacco:  Never  Vaping Use   Vaping status: Never Used  Substance and Sexual Activity   Alcohol use: No    Alcohol/week: 0.0 standard drinks of alcohol   Drug use: No   Sexual activity: Not on file  Other Topics Concern   Not on file  Social History Narrative   Not on file   Social Determinants of Health   Financial Resource Strain: Low Risk  (08/14/2022)   Received from [facility]   Overall Financial Resource Strain (CARDIA)    Difficulty of Paying Living Expenses: Not hard at all  Food Insecurity: No Food Insecurity (08/14/2022)   Received from [facility]   Hunger Vital Sign    Worried About Running Out of Food in the Last Year: Never true    Ran Out of Food in the Last Year: Never true  Transportation Needs: No Transportation Needs (08/14/2022)   Received from [facility] - Transportation    Lack of Transportation (Medical): No    Lack of Transportation (Non-Medical): No  Physical Activity: Sufficiently Active (08/14/2022)   Received from [facility]   Exercise Vital Sign    Days of Exercise per Week: 6 days    Minutes of Exercise per Session: 30 min  Stress: No Stress Concern Present (  08/14/2022)   Received from [facility] - Occupational Stress Questionnaire    Feeling of Stress : Not at all  Social Connections: Socially Integrated (08/14/2022)   Received from [facility]   Social Network    How would you rate your social network (family, work, friends)?: Good participation with social networks   Past Surgical History:  Procedure Laterality Date   APPENDECTOMY  02/05/1977   CHOLECYSTECTOMY N/A 07/01/2021   Procedure: LAPAROSCOPIC CHOLECYSTECTOMY WITH INTRAOPERATIVE CHOLANGIOGRAM;  Surgeon: Almond Lint, MD;  Location: [facility];  Service: General;  Laterality: N/A;   HIP SURGERY Left    OSTEOTOMY  02/06/1991   SHOULDER SURGERY Left    Past Surgical History:  Procedure Laterality Date   APPENDECTOMY  02/05/1977    CHOLECYSTECTOMY N/A 07/01/2021   Procedure: LAPAROSCOPIC CHOLECYSTECTOMY WITH INTRAOPERATIVE CHOLANGIOGRAM;  Surgeon: Almond Lint, MD;  Location: [facility];  Service: General;  Laterality: N/A;   HIP SURGERY Left    OSTEOTOMY  02/06/1991   SHOULDER SURGERY Left    Past Medical History:  Diagnosis Date   Aortic atherosclerosis (HCC) 06/30/2021   Mitral valve prolapse    Question of MVP in 2017 though looks like they didnt see this on repeat echo according to cards 2021 office note.   Urticaria    BP 130/76   Pulse 100   Ht 5\' 4"  (1.626 m)   Wt 169 lb (76.7 kg)   SpO2 95%   BMI 29.01 kg/m   Opioid Risk Score:   Fall Risk Score:  `1  Depression screen PHQ 2/9     10/24/2022    1:00 PM 03/28/2022    1:40 PM  Depression screen PHQ 2/9  Decreased Interest 0 0  Down, Depressed, Hopeless 0 0  PHQ - 2 Score 0 0  Altered sleeping  3  Tired, decreased energy  2  Feeling bad or failure about yourself   0  Trouble concentrating  0  Moving slowly or fidgety/restless  0  Suicidal thoughts  0  PHQ-9 Score  5  Difficult doing work/chores  Somewhat difficult     Review of Systems  Constitutional: Negative.   HENT: Negative.    Eyes: Negative.   Respiratory: Negative.    Cardiovascular: Negative.   Gastrointestinal: Negative.   Endocrine: Negative.   Genitourinary: Negative.   Musculoskeletal:  Positive for back pain and neck pain.  Skin: Negative.   Allergic/Immunologic: Negative.   Neurological: Negative.   Hematological: Negative.   Psychiatric/Behavioral: Negative.    All other systems reviewed and are negative.      Objective:   Physical Exam General: No acute distress HEENT: NCAT, EOMI, oral membranes moist Cards: reg rate  Chest: normal effort Abdomen: Soft, NT, ND Skin: dry, intact Extremities: no edema Psych: pleasant and appropriate  Skin: intact Neuro: Sensory exam normal for light touch and pain in all 4 limbs. No limb ataxia or cerebellar signs. No  abnormal tone appreciated.     Musculoskeletal: Patient with generalized tenderness once again in her neck as well as trapezius muscles elbows hips as well as her hands and fingers.  There is some chronic degenerative changes noted but they are not severe in outward appearance.  Posture is fair.  Still somewhat of a head forward position but improved from her last visit.  She has good standing balance and gait appears normal.  Assessment & Plan:  Chronic pain syndrome most consistent with fibromyalgia.    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  She has had numerous surgeries involving the left shoulder hip and knee over the last few years       Plan: Increase cymbalta to 90mg  daily to better capture generalized pain.  It may help with her energy levels as well.Marland Kitchen  PCP has made referral to aquatic therapy at [facility]. We'll see how she does with that. Might benefit from water exercise program. [facility] offers.  I was able to locate a water therapy schedule at the [facility] here locally. -We talked a bit about some of her past workup and she really has had a formal and thorough rheumatological workup as far as I can see.  I really do not see the utility in pursuing this further at this point.  She does have questions about some of these test I did recommend that she follow-up with rheumatology with any further questions. Continue tizanidine   4-6 mg at night.  Transition from gabapentin to lyrica .  Instructions were provided-- Resume  diclofenac 75 mg p.o. twice daily scheduled with food. Check labs later this year.  She was advised to stop the medication if she experiences any GI upset Supplements : fish oil, vitamin D, magnesium.  Continue coenzyme Q 10 and turmeric/  -tart cherry extract for joint pain   About 20 + minutes of time was spent today with the patient in discussion and assessment of her pain and coming up with a custom  treatment plan.  I will see her back in about 3 months time.

## 2022-10-24 NOTE — Patient Instructions (Signed)
GABAPENTIN TO LYRICA TRANSITION:  WEEK ONE DECREASE GABAPENTIN TO TWICE DAILY BEGIN LYRICA AT BEDTIME  WEEK TWO DECREASE GABAPENTIN TO MID DAY ONL INCREASE LYRICA TO TWICE DAILY  WEEK THREE  STOP GABAPENTIN INCREASE LYRICA TO THREE X DAILY

## 2022-10-25 ENCOUNTER — Ambulatory Visit: Payer: No Typology Code available for payment source | Attending: Physician Assistant | Admitting: Physical Therapy

## 2022-10-25 ENCOUNTER — Encounter: Payer: Self-pay | Admitting: Physical Therapy

## 2022-10-25 DIAGNOSIS — R2689 Other abnormalities of gait and mobility: Secondary | ICD-10-CM | POA: Diagnosis present

## 2022-10-25 DIAGNOSIS — M6281 Muscle weakness (generalized): Secondary | ICD-10-CM | POA: Insufficient documentation

## 2022-10-25 NOTE — Therapy (Signed)
OUTPATIENT PHYSICAL THERAPY NEURO TREATMENT   Patient Name: Tiffany Cunningham MRN: [id number] DOB:[birth year], 67 y.o., female Today's Date: 10/25/2022   PCP: Ladora Daniel, PA-C REFERRING PROVIDER: Ladora Daniel, PA-C  END OF SESSION:  PT End of Session - 10/25/22 1137     Visit Number 2    Number of Visits 7   with eval   Date for PT Re-Evaluation 11/30/22    Authorization Type [employer]    PT Start Time [phone]    PT Stop Time 1015    PT Time Calculation (min) 44 min    Equipment Utilized During Treatment Other (comment)   multicolor/low resistance dumbbells, blue solid/moderate resistance pool noodle   Activity Tolerance Patient tolerated treatment well    Behavior During Therapy WFL for tasks assessed/performed             Past Medical History:  Diagnosis Date   Aortic atherosclerosis (HCC) 06/30/2021   Mitral valve prolapse    Question of MVP in 2017 though looks like they didnt see this on repeat echo according to cards 2021 office note.   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   Myofascial pain 07/25/2022   Fibromyalgia 03/28/2022   Acute gallstone pancreatitis 06/30/2021   Pyuria 06/30/2021   Aortic atherosclerosis (HCC) 06/30/2021   Mitral valve prolapse 03/22/2015   Mitral regurgitation 03/22/2015   Palpitations 03/22/2015   Essential hypertension 03/22/2015   Seasonal and perennial allergic rhinoconjunctivitis 02/23/2015    ONSET DATE: 08/21/2022 (referral date)  REFERRING DIAG: M79.7 (ICD-10-CM) - Fibromyalgia M79.18 (ICD-10-CM) - Myalgia, other  site  THERAPY DIAG:  Muscle weakness (generalized)  Other abnormalities of gait and mobility  Rationale for Evaluation and Treatment: Rehabilitation  SUBJECTIVE:                                                                                                                                                                                             SUBJECTIVE STATEMENT: Patient states she is sore on land today, especially her right shoulder and arm, but is hopeful the heat from the pool will help.  She would like to return to community pool based activity if this helps.  Pt accompanied by: self  PERTINENT  HISTORY: PMH: Aortic atherosclerosis, mitral valve prolapse, urticaria, essential HTN, fibromylagia, osteopenia after menopause  PAIN:  Are you having pain? Yes: NPRS scale: 5/10 Pain location: whole body (concentrated in RUE today) Pain description: ache and pains, burning in elbows Aggravating factors: activity, inactivity, lack of sleep Relieving factors: being in water  PRECAUTIONS: None  RED FLAGS: None   WEIGHT BEARING RESTRICTIONS: No  FALLS: Has patient fallen in last 6 months? No  LIVING ENVIRONMENT: Lives with: lives with their spouse Lives in: House/apartment Stairs: Yes: Internal: 14 steps; on right going up Has following equipment at home: None  PLOF: Independent with gait and Independent with transfers  PATIENT GOALS: "to improve pain"  OBJECTIVE:   DIAGNOSTIC FINDINGS: None relevant to this POC  COGNITION: Overall cognitive status: Within functional limits for tasks assessed   SENSATION: Reports no N/T in UE or LE Light touch WFL   EDEMA:  R ankle always swollen since surgery   POSTURE: rounded shoulders and forward head   LOWER EXTREMITY MMT:    MMT Right Eval Left Eval  Hip flexion 5 5  Hip extension    Hip abduction    Hip adduction    Hip internal rotation    Hip external rotation    Knee flexion 5 5  Knee extension 5 5   Ankle dorsiflexion 5 5  Ankle plantarflexion    Ankle inversion    Ankle eversion    (Blank rows = not tested)  BED MOBILITY:  Mod I per pt report  TRANSFERS: Assistive device utilized: None  Sit to stand: Modified independence Stand to sit: Modified independence Chair to chair: Modified independence Floor:  not assessed at eval   STAIRS: Level of Assistance: Modified independence Stair Negotiation Technique: Alternating Pattern  with Bilateral Rails Number of Stairs: 6  Height of Stairs: 4  Comments: needs BUE support  GAIT: Gait pattern: antalgic Distance walked: various clinic distances Assistive device utilized: None Level of assistance: Modified independence Comments: Needs increased time  FUNCTIONAL TESTS:      PATIENT SURVEYS:  FIQR (Revised Fibromyalgia Impact Questionnaire): 27.8/100  TODAY'S TREATMENT:                                                                                                                              10/25/2022: Aquatic therapy at [facility] - pool temperature 92 degrees   Patient seen for aquatic therapy today.  Treatment took place in water 3.6-4.8 feet deep depending upon activity.  Patient entered and exited the pool via stairs using bilateral rails and step-to pattern at mod I level.   Exercises: Warmup:  water walking forwards, backwards, and laterally 4 x 18 ft Walking marches 4x18 ft Lateral lunges w/ shoulder abduction/adduction w/ light resistance dumbbells Squats at wall x20 Wall plank kickbacks x20 alt LE Seated knee to chest stretch 2x45 seconds each LE Seated hamstring stretch w/ noodle x1 minute each > standing hamstring stretch w/ noodle x3  minutes each oscillating into hip abduction for varied stretch; pt reports more difficulty with this stretch STS from bench in 3 ft 6 in water 2x10  Patient requires buoyancy of the water for support for reduced fall risk with gait training and balance exercises with mod I  to SBA support. Exercises able to be performed safely in water without the risk of fall compared to those same exercises performed on land; viscosity of water needed for resistance for strengthening. Current of water provides perturbations for challenging static and dynamic balance.   PATIENT EDUCATION: Education details: Aquatic rationale and expected levels of MSK pain with movement vs rest and how to measure this when doing HEP in future.  Person educated: Patient Education method: Explanation Education comprehension: verbalized understanding  HOME EXERCISE PROGRAM: To be initiated  GOALS: Goals reviewed with patient? Yes  SHORT TERM GOALS: Target date: 11/02/2022   Pt will be independent with initial aquatic HEP for improved strength, balance, transfers and gait and decreased pain. Baseline: Goal status: INITIAL    LONG TERM GOALS: Target date: 11/30/2022   Pt will be independent with final aquatic HEP for improved strength, balance, transfers and gait and decreased pain. Baseline:  Goal status: INITIAL  2.  Pt will improve 5 x STS to less than or equal to 13 seconds to demonstrate improved functional strength and transfer efficiency.  Baseline: 16.34 sec (8/30) Goal status: INITIAL  3.  Pt will improve FGA to 28/30 for decreased fall risk  Baseline: 24/30 (8/30) Goal status: INITIAL  4.  Pt will improve her score on the FIQR to 22/100 to demonstrate decreased disability level. Baseline: 27.8/100 (8/30) Goal status: INITIAL   ASSESSMENT:  CLINICAL IMPRESSION: Aquatic therapy initiated today with patient responding well to all interventions.  Her pain appears better managed in warm water and she demonstrates good movement patterns.  Will attempt to progress resistance and increase balance challenges in coming sessions.  Planning to initiate aquatic HEP in future visit.  OBJECTIVE IMPAIRMENTS: Abnormal gait, decreased activity tolerance, decreased balance, decreased  knowledge of condition, and pain.   ACTIVITY LIMITATIONS: carrying, lifting, bending, sitting, standing, sleeping, stairs, transfers, and hygiene/grooming  PARTICIPATION LIMITATIONS: meal prep, cleaning, laundry, interpersonal relationship, driving, shopping, and community activity  PERSONAL FACTORS: Time since onset of injury/illness/exacerbation and 1-2 comorbidities:    Aortic atherosclerosis, mitral valve prolapse, urticaria, essential HTN, fibromylagia, osteopenia after menopauseare also affecting patient's functional outcome.   REHAB POTENTIAL: Fair time since onset, has tried PT before with no changes in symptoms (has not tried aquatic therapy before)  CLINICAL DECISION MAKING: Stable/uncomplicated  EVALUATION COMPLEXITY: Low  PLAN:  PT FREQUENCY: 1x/week  PT DURATION: 6 weeks  PLANNED INTERVENTIONS: Therapeutic exercises, Therapeutic activity, Neuromuscular re-education, Balance training, Gait training, Patient/Family education, Self Care, Joint mobilization, Stair training, Aquatic Therapy, Dry Needling, Electrical stimulation, Cryotherapy, Moist heat, Taping, Manual therapy, and Re-evaluation  PLAN FOR NEXT SESSION: aquatic therapy for pain management (fibromyalgia) and work towards creating an aquatic HEP for patient - provide list of community pools!  Ai Chi?  Camille Bal, PT, DPT  10/25/2022, 11:44 AM

## 2022-11-01 ENCOUNTER — Ambulatory Visit: Payer: No Typology Code available for payment source | Admitting: Physical Therapy

## 2022-11-01 ENCOUNTER — Encounter: Payer: Self-pay | Admitting: Physical Therapy

## 2022-11-01 DIAGNOSIS — M6281 Muscle weakness (generalized): Secondary | ICD-10-CM | POA: Diagnosis not present

## 2022-11-01 DIAGNOSIS — R2689 Other abnormalities of gait and mobility: Secondary | ICD-10-CM

## 2022-11-01 NOTE — Therapy (Signed)
OUTPATIENT PHYSICAL THERAPY NEURO TREATMENT   Patient Name: Tiffany Cunningham MRN: [id number] DOB:11-13-1955, 67 y.o., female Today's Date: 11/01/2022   PCP: Ladora Daniel, PA-C REFERRING PROVIDER: Ladora Daniel, PA-C  END OF SESSION:  PT End of Session - 11/01/22 1055     Visit Number 3    Number of Visits 7   with eval   Date for PT Re-Evaluation 11/30/22    Authorization Type [employer]    PT Start Time 0845    PT Stop Time 0928    PT Time Calculation (min) 43 min    Activity Tolerance Patient tolerated treatment well    Behavior During Therapy [facility] for tasks assessed/performed             Past Medical History:  Diagnosis Date   Aortic atherosclerosis (HCC) 06/30/2021   Mitral valve prolapse    Question of MVP in 2017 though looks like they didnt see this on repeat echo according to cards 2021 office note.   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   Myofascial pain 07/25/2022   Fibromyalgia 03/28/2022   Acute gallstone pancreatitis 06/30/2021   Pyuria 06/30/2021   Aortic atherosclerosis (HCC) 06/30/2021   Mitral valve prolapse 03/22/2015   Mitral regurgitation 03/22/2015   Palpitations 03/22/2015   Essential hypertension 03/22/2015   Seasonal and perennial allergic rhinoconjunctivitis 02/23/2015    ONSET DATE: 08/21/2022 (referral date)  REFERRING DIAG: M79.7 (ICD-10-CM) - Fibromyalgia M79.18 (ICD-10-CM) - Myalgia, other site  THERAPY DIAG:  Muscle weakness (generalized)  Other abnormalities of gait and mobility  Rationale for Evaluation and Treatment:  Rehabilitation  SUBJECTIVE:                                                                                                                                                                                             SUBJECTIVE STATEMENT: Patient states she is feeing good today and this is a good start to a busy day for her as she is traveling this weekend.  Pt accompanied by: self  PERTINENT HISTORY: PMH: Aortic atherosclerosis, mitral valve prolapse, urticaria, essential HTN, fibromylagia, osteopenia after menopause  PAIN:  Are you having pain? Pt states she feels "fine" today.  PRECAUTIONS: None  RED FLAGS: None   WEIGHT BEARING RESTRICTIONS: No  FALLS: Has patient fallen in last 6 months? No  LIVING ENVIRONMENT: Lives with: lives with their spouse Lives in: House/apartment Stairs: Yes: Internal: 14 steps; on right going up Has following equipment at home: None  PLOF: Independent with gait and Independent with transfers  PATIENT GOALS: "to improve pain"  OBJECTIVE:   DIAGNOSTIC FINDINGS: None relevant to this POC  COGNITION: Overall cognitive status: Within functional limits for tasks assessed   SENSATION: Reports no N/T in UE or LE Light touch WFL   EDEMA:  R ankle always swollen since surgery   POSTURE: rounded shoulders and forward head   LOWER EXTREMITY MMT:    MMT Right Eval Left Eval  Hip flexion 5 5  Hip extension    Hip abduction    Hip adduction    Hip internal rotation    Hip external rotation    Knee flexion 5 5  Knee extension 5 5  Ankle dorsiflexion 5 5  Ankle plantarflexion    Ankle inversion    Ankle eversion    (Blank rows = not tested)  BED MOBILITY:  Mod I per pt report  TRANSFERS: Assistive device utilized: None  Sit to stand: Modified independence Stand to sit: Modified independence Chair to chair: Modified independence Floor:  not assessed at eval   STAIRS: Level of Assistance: Modified independence Stair  Negotiation Technique: Alternating Pattern  with Bilateral Rails Number of Stairs: 6  Height of Stairs: 4  Comments: needs BUE support  GAIT: Gait pattern: antalgic Distance walked: various clinic distances Assistive device utilized: None Level of assistance: Modified independence Comments: Needs increased time  FUNCTIONAL TESTS:      PATIENT SURVEYS:  FIQR (Revised Fibromyalgia Impact Questionnaire): 27.8/100  TODAY'S TREATMENT:                                                                                                                              11/01/2022: Aquatic therapy at [facility] - pool temperature 92 degrees   Patient seen for aquatic therapy today.  Treatment took place in water 4.0 feet deep depending upon activity.  Patient entered and exited the pool via stairs using right rail and reciprocal pattern at mod I level.   Exercises: Warmup:  water walking forwards, backwards, and laterally 4 x 18 ft in 4 ft water no support IND  Reviewed Ai Chi postures 1-10, 10 reps each (performed bilaterally for postures that apply), return demo and verbal cues used to correct form and encourage core activation, ankle strategy, and paced breathing.  Patient requires buoyancy of the water for support for reduced fall risk with gait training and balance exercises with max of mod I support. Exercises able to be performed safely in water without the risk of fall compared to those same exercises performed on land; viscosity of water needed for resistance for strengthening. Current of water provides perturbations for challenging static and dynamic  balance.   PATIENT EDUCATION: Education details: Plan to complete Ai Chi next visit and PT to provide HEP towards end of POC to reflect exercises completed in sessions. Person educated: Patient Education method: Explanation Education comprehension: verbalized understanding  HOME EXERCISE PROGRAM: To be initiated  GOALS: Goals reviewed  with patient? Yes  SHORT TERM GOALS: Target date: 11/02/2022   Pt will be independent with initial aquatic HEP for improved strength, balance, transfers and gait and decreased pain. Baseline:  Initiated Ai Chi, will establish LE and core strength and balance HEP in coming visits (9/26) Goal status: IN PROGRESS  LONG TERM GOALS: Target date: 11/30/2022   Pt will be independent with final aquatic HEP for improved strength, balance, transfers and gait and decreased pain. Baseline:  Goal status: INITIAL  2.  Pt will improve 5 x STS to less than or equal to 13 seconds to demonstrate improved functional strength and transfer efficiency.  Baseline: 16.34 sec (8/30) Goal status: INITIAL  3.  Pt will improve FGA to 28/30 for decreased fall risk  Baseline: 24/30 (8/30) Goal status: INITIAL  4.  Pt will improve her score on the FIQR to 22/100 to demonstrate decreased disability level. Baseline: 27.8/100 (8/30) Goal status: INITIAL   ASSESSMENT:  CLINICAL IMPRESSION: Aquatic therapy completed today with patient having great response to water temperature for pain management.  She endorses that this has been a preferred means of exercise for her and goal of transition to community pool remains the same.  PT initiated Ai Chi today with handout to be finished next visit, will provide pt a copy with her aquatic HEP in coming visits.  Will continue per POC.  OBJECTIVE IMPAIRMENTS: Abnormal gait, decreased activity tolerance, decreased balance, decreased knowledge of condition, and pain.   ACTIVITY LIMITATIONS: carrying, lifting, bending, sitting, standing, sleeping, stairs, transfers, and hygiene/grooming  PARTICIPATION LIMITATIONS: meal prep, cleaning, laundry, interpersonal relationship, driving, shopping, and community activity  PERSONAL FACTORS: Time since onset of injury/illness/exacerbation and 1-2 comorbidities:    Aortic atherosclerosis, mitral valve prolapse, urticaria, essential HTN,  fibromylagia, osteopenia after menopauseare also affecting patient's functional outcome.   REHAB POTENTIAL: Fair time since onset, has tried PT before with no changes in symptoms (has not tried aquatic therapy before)  CLINICAL DECISION MAKING: Stable/uncomplicated  EVALUATION COMPLEXITY: Low  PLAN:  PT FREQUENCY: 1x/week  PT DURATION: 6 weeks  PLANNED INTERVENTIONS: Therapeutic exercises, Therapeutic activity, Neuromuscular re-education, Balance training, Gait training, Patient/Family education, Self Care, Joint mobilization, Stair training, Aquatic Therapy, Dry Needling, Electrical stimulation, Cryotherapy, Moist heat, Taping, Manual therapy, and Re-evaluation  PLAN FOR NEXT SESSION: aquatic therapy for pain management (fibromyalgia) and work towards creating an aquatic HEP for patient - provide list of community pools!  Janett Billow Chi and work on core exercises.  Camille Bal, PT, DPT  11/01/2022, 10:58 AM

## 2022-11-08 ENCOUNTER — Ambulatory Visit: Payer: No Typology Code available for payment source | Attending: Physician Assistant | Admitting: Physical Therapy

## 2022-11-08 DIAGNOSIS — R2689 Other abnormalities of gait and mobility: Secondary | ICD-10-CM | POA: Diagnosis present

## 2022-11-08 DIAGNOSIS — M797 Fibromyalgia: Secondary | ICD-10-CM | POA: Insufficient documentation

## 2022-11-08 DIAGNOSIS — M6281 Muscle weakness (generalized): Secondary | ICD-10-CM | POA: Diagnosis present

## 2022-11-09 NOTE — Therapy (Signed)
OUTPATIENT PHYSICAL THERAPY NEURO TREATMENT   Patient Name: Tiffany Cunningham MRN: [id number] DOB:[birth year], 67 y.o., female Today's Date: 11/09/2022   PCP: Ladora Daniel, PA-C REFERRING PROVIDER: Ladora Daniel, PA-C  END OF SESSION:  11/08/22 0845  PT Visits / Re-Eval  Visit Number 4  Number of Visits 7 (with eval)  Date for PT Re-Evaluation 11/30/22  Authorization  Authorization Type [employer]  PT Time Calculation  PT Start Time 0845  PT Stop Time 0930  PT Time Calculation (min) 45 min  PT - End of Session  Activity Tolerance Patient tolerated treatment well  Behavior During Therapy [facility] for tasks assessed/performed    Past Medical History:  Diagnosis Date   Aortic atherosclerosis (HCC) 06/30/2021   Mitral valve prolapse    Question of MVP in 2017 though looks like they didnt see this on repeat echo according to cards 2021 office note.   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   Myofascial pain 07/25/2022   Fibromyalgia 03/28/2022   Acute gallstone pancreatitis 06/30/2021   Pyuria 06/30/2021   Aortic atherosclerosis (HCC) 06/30/2021   Mitral valve prolapse 03/22/2015   Mitral regurgitation 03/22/2015   Palpitations 03/22/2015   Essential hypertension 03/22/2015   Seasonal and perennial allergic rhinoconjunctivitis 02/23/2015    ONSET DATE: 08/21/2022 (referral date)  REFERRING DIAG: M79.7 (ICD-10-CM) - Fibromyalgia M79.18 (ICD-10-CM) - Myalgia, other site  THERAPY DIAG:  Muscle weakness (generalized)  Other abnormalities of gait and mobility  Rationale for  Evaluation and Treatment: Rehabilitation  SUBJECTIVE:                                                                                                                                                                                             SUBJECTIVE STATEMENT: Patient has had a lot of whole body pain since returning from her trip to [place].  She drove most of the distance and feels this exacerbated it.  Pt accompanied by: self  PERTINENT HISTORY: PMH: Aortic atherosclerosis, mitral valve prolapse, urticaria, essential HTN, fibromylagia, osteopenia after menopause  PAIN:  Are you having pain? Yes: NPRS scale: 5/10 Pain location: whole body Pain description: achy Aggravating factors: general movement Relieving factors: nothing  PRECAUTIONS: None  RED FLAGS:  None   WEIGHT BEARING RESTRICTIONS: No  FALLS: Has patient fallen in last 6 months? No  LIVING ENVIRONMENT: Lives with: lives with their spouse Lives in: House/apartment Stairs: Yes: Internal: 14 steps; on right going up Has following equipment at home: None  PLOF: Independent with gait and Independent with transfers  PATIENT GOALS: "to improve pain"  OBJECTIVE:   DIAGNOSTIC FINDINGS: None relevant to this POC  COGNITION: Overall cognitive status: Within functional limits for tasks assessed   SENSATION: Reports no N/T in UE or LE Light touch WFL   EDEMA:  R ankle always swollen since surgery   POSTURE: rounded shoulders and forward head   LOWER EXTREMITY MMT:    MMT Right Eval Left Eval  Hip flexion 5 5  Hip extension    Hip abduction    Hip adduction    Hip internal rotation    Hip external rotation    Knee flexion 5 5  Knee extension 5 5  Ankle dorsiflexion 5 5  Ankle plantarflexion    Ankle inversion    Ankle eversion    (Blank rows = not tested)  BED MOBILITY:  Mod I per pt report  TRANSFERS: Assistive device utilized: None  Sit to stand: Modified independence Stand to sit: Modified  independence Chair to chair: Modified independence Floor:  not assessed at eval   STAIRS: Level of Assistance: Modified independence Stair Negotiation Technique: Alternating Pattern  with Bilateral Rails Number of Stairs: 6  Height of Stairs: 4  Comments: needs BUE support  GAIT: Gait pattern: antalgic Distance walked: various clinic distances Assistive device utilized: None Level of assistance: Modified independence Comments: Needs increased time  FUNCTIONAL TESTS:      PATIENT SURVEYS:  FIQR (Revised Fibromyalgia Impact Questionnaire): 27.8/100  TODAY'S TREATMENT:                                                                                                                              11/08/2022: Aquatic therapy at [facility] - pool temperature 92 degrees   Patient seen for aquatic therapy today.  Treatment took place in water 4.0 feet deep.  Patient entered and exited the pool via stairs using bilateral rails and reciprocal pattern at mod I level.   Exercises: Warmup:  water walking forwards, backwards, and laterally 4 x 18 ft in 4 ft water no support IND  Reviewed Ai Chi postures 10-16, 10 reps each (performed bilaterally for postures that apply), return demo and verbal cues used to correct form and encourage core activation, ankle strategy, and paced breathing.  Patient requires buoyancy of the water for support for reduced fall risk with gait training and balance exercises with max of mod I support. Exercises able to be performed safely in water without the risk of fall compared to those same exercises performed on land; viscosity of water needed for resistance for strengthening. Current of water provides perturbations for challenging static and dynamic balance.   PATIENT EDUCATION:  Education details: Will provide list of community pools, ai chi and aquatic HEP handout at last pool session. Person educated: Patient Education method: Explanation Education  comprehension: verbalized understanding  HOME EXERCISE PROGRAM: To be initiated  GOALS: Goals reviewed with patient? Yes  SHORT TERM GOALS: Target date: 11/02/2022   Pt will be independent with initial aquatic HEP for improved strength, balance, transfers and gait and decreased pain. Baseline:  Initiated Ai Chi, will establish LE and core strength and balance HEP in coming visits (9/26) Goal status: IN PROGRESS  LONG TERM GOALS: Target date: 11/30/2022   Pt will be independent with final aquatic HEP for improved strength, balance, transfers and gait and decreased pain. Baseline:  Goal status: INITIAL  2.  Pt will improve 5 x STS to less than or equal to 13 seconds to demonstrate improved functional strength and transfer efficiency.  Baseline: 16.34 sec (8/30) Goal status: INITIAL  3.  Pt will improve FGA to 28/30 for decreased fall risk  Baseline: 24/30 (8/30) Goal status: INITIAL  4.  Pt will improve her score on the FIQR to 22/100 to demonstrate decreased disability level. Baseline: 27.8/100 (8/30) Goal status: INITIAL   ASSESSMENT:  CLINICAL IMPRESSION: Aquatic therapy completed today with patient challenged by higher level Ai Chi postures.  She tolerates movement well in warm water demonstrating appropriate pain responses.  She continues to benefit from skilled PT, particularly in the aquatic setting, to further prepare her for continued community based pool exercise to maintain progress.  OBJECTIVE IMPAIRMENTS: Abnormal gait, decreased activity tolerance, decreased balance, decreased knowledge of condition, and pain.   ACTIVITY LIMITATIONS: carrying, lifting, bending, sitting, standing, sleeping, stairs, transfers, and hygiene/grooming  PARTICIPATION LIMITATIONS: meal prep, cleaning, laundry, interpersonal relationship, driving, shopping, and community activity  PERSONAL FACTORS: Time since onset of injury/illness/exacerbation and 1-2 comorbidities:    Aortic  atherosclerosis, mitral valve prolapse, urticaria, essential HTN, fibromylagia, osteopenia after menopauseare also affecting patient's functional outcome.   REHAB POTENTIAL: Fair time since onset, has tried PT before with no changes in symptoms (has not tried aquatic therapy before)  CLINICAL DECISION MAKING: Stable/uncomplicated  EVALUATION COMPLEXITY: Low  PLAN:  PT FREQUENCY: 1x/week  PT DURATION: 6 weeks  PLANNED INTERVENTIONS: Therapeutic exercises, Therapeutic activity, Neuromuscular re-education, Balance training, Gait training, Patient/Family education, Self Care, Joint mobilization, Stair training, Aquatic Therapy, Dry Needling, Electrical stimulation, Cryotherapy, Moist heat, Taping, Manual therapy, and Re-evaluation  PLAN FOR NEXT SESSION: aquatic therapy for pain management (fibromyalgia) and work towards creating an aquatic HEP for patient - provide list of community pools!  work on core exercises.  Camille Bal, PT, DPT  11/09/2022, 9:10 AM

## 2022-11-15 ENCOUNTER — Ambulatory Visit: Payer: No Typology Code available for payment source | Admitting: Physical Therapy

## 2022-11-15 DIAGNOSIS — R2689 Other abnormalities of gait and mobility: Secondary | ICD-10-CM

## 2022-11-15 DIAGNOSIS — M6281 Muscle weakness (generalized): Secondary | ICD-10-CM

## 2022-11-15 DIAGNOSIS — M797 Fibromyalgia: Secondary | ICD-10-CM

## 2022-11-20 ENCOUNTER — Encounter: Payer: Self-pay | Admitting: Physical Therapy

## 2022-11-20 NOTE — Therapy (Signed)
OUTPATIENT PHYSICAL THERAPY NEURO TREATMENT   Patient Name: Tiffany Cunningham MRN: [id number] DOB:May 22, 1955, 67 y.o., female Today's Date: 11/20/2022   PCP: Ladora Daniel, PA-C REFERRING PROVIDER: Ladora Daniel, PA-C  END OF SESSION:   11/15/22 0930  PT Visits / Re-Eval  Visit Number 5  Number of Visits 7 (with eval)  Date for PT Re-Evaluation 11/30/22  Authorization  Authorization Type [employer]et[employer]  PT Time Calculation  PT Start Time 0930  PT Stop Time 1015  PT Time Calculation (min) 45 min  PT - End of Session  Equipment Utilized During Treatment Other (comment) (yellow noodle, multicolor low resistance dumbbells)  Activity Tolerance Patient tolerated treatment well  Behavior During Therapy WFL for tasks assessed/performed    Past Medical History:  Diagnosis Date   Aortic atherosclerosis (HCC) 06/30/2021   Mitral valve prolapse    Question of MVP in 2017 though looks like they didnt see this on repeat echo according to cards 2021 office note.   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   Myofascial pain 07/25/2022   Fibromyalgia 03/28/2022   Acute gallstone pancreatitis 06/30/2021   Pyuria 06/30/2021   Aortic atherosclerosis (HCC) 06/30/2021   Mitral valve prolapse 03/22/2015   Mitral regurgitation 03/22/2015   Palpitations 03/22/2015   Essential hypertension 03/22/2015   Seasonal and perennial allergic rhinoconjunctivitis 02/23/2015    ONSET DATE: 08/21/2022 (referral date)  REFERRING DIAG: M79.7 (ICD-10-CM) - Fibromyalgia M79.18 (ICD-10-CM) - Myalgia, other  site  THERAPY DIAG:  Muscle weakness (generalized)  Other abnormalities of gait and mobility  Fibromyalgia  Rationale for Evaluation and Treatment: Rehabilitation  SUBJECTIVE:                                                                                                                                                                                             SUBJECTIVE STATEMENT: Patient is having less pain today.  Pt accompanied by: self  PERTINENT HISTORY: PMH: Aortic atherosclerosis, mitral valve prolapse, urticaria, essential HTN, fibromylagia, osteopenia after menopause  PAIN:  Are you having pain? Yes: NPRS scale: 3/10 Pain location: whole body Pain description: achy Aggravating factors: general movement Relieving factors: nothing  PRECAUTIONS: None  RED FLAGS: None   WEIGHT BEARING RESTRICTIONS:  No  FALLS: Has patient fallen in last 6 months? No  LIVING ENVIRONMENT: Lives with: lives with their spouse Lives in: House/apartment Stairs: Yes: Internal: 14 steps; on right going up Has following equipment at home: None  PLOF: Independent with gait and Independent with transfers  PATIENT GOALS: "to improve pain"  OBJECTIVE:   DIAGNOSTIC FINDINGS: None relevant to this POC  COGNITION: Overall cognitive status: Within functional limits for tasks assessed   SENSATION: Reports no N/T in UE or LE Light touch WFL   EDEMA:  R ankle always swollen since surgery   POSTURE: rounded shoulders and forward head   LOWER EXTREMITY MMT:    MMT Right Eval Left Eval  Hip flexion 5 5  Hip extension    Hip abduction    Hip adduction    Hip internal rotation    Hip external rotation    Knee flexion 5 5  Knee extension 5 5  Ankle dorsiflexion 5 5  Ankle plantarflexion    Ankle inversion    Ankle eversion    (Blank rows = not tested)  BED MOBILITY:  Mod I per pt report  TRANSFERS: Assistive device utilized: None  Sit to stand: Modified  independence Stand to sit: Modified independence Chair to chair: Modified independence Floor:  not assessed at eval   STAIRS: Level of Assistance: Modified independence Stair Negotiation Technique: Alternating Pattern  with Bilateral Rails Number of Stairs: 6  Height of Stairs: 4  Comments: needs BUE support  GAIT: Gait pattern: antalgic Distance walked: various clinic distances Assistive device utilized: None Level of assistance: Modified independence Comments: Needs increased time  FUNCTIONAL TESTS:      PATIENT SURVEYS:  FIQR (Revised Fibromyalgia Impact Questionnaire): 27.8/100  TODAY'S TREATMENT:                                                                                                                              11/08/2022: Aquatic therapy at [facility] - pool temperature 92 degrees   Patient seen for aquatic therapy today.  Treatment took place in water 3'6" - 4\' 8"  deep.  Patient entered and exited the pool via stairs using bilateral rails and reciprocal pattern at mod I level.   Exercises: Warmup:  water walking forwards, backwards, and laterally 4 x 18 ft in 4 ft water no support IND  Using yellow noodle: -swing sitting for trunk activation x1 minute > overhead arm raises several reps to increase challenge, pt had to work into larger ROM due to imbalance > straddle sitting on noodle using UE/LE to paddle 2x18 ft > straddle sitting on noodle using UE only with multicolored dumbbells to paddle across pool 2x18 ft, pt reports good challenge without pain -Planks w/ yellow noodle x10 with 10 second hold each -Coordination jumping jacks w/ dumbbells > stride jacks w/ dumbbells 2 rounds of each about 10 reps each round, cued for trunk engagement to prevent rotation -Seated flutter kicks 2 rounds x2  minutes each w/o UE support to further challenge core  Patient requires buoyancy of the water for support for reduced fall risk with gait training and balance exercises  with max of mod I support. Exercises able to be performed safely in water without the risk of fall compared to those same exercises performed on land; viscosity of water needed for resistance for strengthening. Current of water provides perturbations for challenging static and dynamic balance.   PATIENT EDUCATION: Education details: Will provide list of community pools, ai chi and aquatic HEP handout at last pool session. Person educated: Patient Education method: Explanation Education comprehension: verbalized understanding  HOME EXERCISE PROGRAM: To be initiated  GOALS: Goals reviewed with patient? Yes  SHORT TERM GOALS: Target date: 11/02/2022   Pt will be independent with initial aquatic HEP for improved strength, balance, transfers and gait and decreased pain. Baseline:  Initiated Ai Chi, will establish LE and core strength and balance HEP in coming visits (9/26) Goal status: IN PROGRESS  LONG TERM GOALS: Target date: 11/30/2022   Pt will be independent with final aquatic HEP for improved strength, balance, transfers and gait and decreased pain. Baseline:  Goal status: INITIAL  2.  Pt will improve 5 x STS to less than or equal to 13 seconds to demonstrate improved functional strength and transfer efficiency.  Baseline: 16.34 sec (8/30) Goal status: INITIAL  3.  Pt will improve FGA to 28/30 for decreased fall risk  Baseline: 24/30 (8/30) Goal status: INITIAL  4.  Pt will improve her score on the FIQR to 22/100 to demonstrate decreased disability level. Baseline: 27.8/100 (8/30) Goal status: INITIAL   ASSESSMENT:  CLINICAL IMPRESSION: PT to initiate aquatic HEP for review next session with exercises already performed as pt has displayed great safety, independence and pain response to aquatic interventions.  She would benefit from further community based activity in this setting.  At current she will likely be appropriate for discharge at last scheduled appt in  POC.  OBJECTIVE IMPAIRMENTS: Abnormal gait, decreased activity tolerance, decreased balance, decreased knowledge of condition, and pain.   ACTIVITY LIMITATIONS: carrying, lifting, bending, sitting, standing, sleeping, stairs, transfers, and hygiene/grooming  PARTICIPATION LIMITATIONS: meal prep, cleaning, laundry, interpersonal relationship, driving, shopping, and community activity  PERSONAL FACTORS: Time since onset of injury/illness/exacerbation and 1-2 comorbidities:    Aortic atherosclerosis, mitral valve prolapse, urticaria, essential HTN, fibromylagia, osteopenia after menopauseare also affecting patient's functional outcome.   REHAB POTENTIAL: Fair time since onset, has tried PT before with no changes in symptoms (has not tried aquatic therapy before)  CLINICAL DECISION MAKING: Stable/uncomplicated  EVALUATION COMPLEXITY: Low  PLAN:  PT FREQUENCY: 1x/week  PT DURATION: 6 weeks  PLANNED INTERVENTIONS: Therapeutic exercises, Therapeutic activity, Neuromuscular re-education, Balance training, Gait training, Patient/Family education, Self Care, Joint mobilization, Stair training, Aquatic Therapy, Dry Needling, Electrical stimulation, Cryotherapy, Moist heat, Taping, Manual therapy, and Re-evaluation  PLAN FOR NEXT SESSION: aquatic therapy for pain management (fibromyalgia) and work towards creating an aquatic HEP for patient - provide list of community pools!    Camille Bal, PT, DPT  11/20/2022, 10:08 AM

## 2022-11-22 ENCOUNTER — Ambulatory Visit: Payer: No Typology Code available for payment source | Admitting: Physical Therapy

## 2022-11-22 ENCOUNTER — Encounter: Payer: Self-pay | Admitting: Physical Therapy

## 2022-11-22 DIAGNOSIS — M6281 Muscle weakness (generalized): Secondary | ICD-10-CM | POA: Diagnosis not present

## 2022-11-22 DIAGNOSIS — R2689 Other abnormalities of gait and mobility: Secondary | ICD-10-CM

## 2022-11-22 NOTE — Therapy (Signed)
OUTPATIENT PHYSICAL THERAPY NEURO TREATMENT   Patient Name: Tiffany Cunningham MRN: [id number] DOB:1955-11-04, 67 y.o., female Today's Date: 11/22/2022   PCP: Ladora Daniel, PA-C REFERRING PROVIDER: Ladora Daniel, PA-C  END OF SESSION:   PT End of Session - 11/22/22 1022     Visit Number 6    Number of Visits 7   with eval   Date for PT Re-Evaluation 11/30/22    Authorization Type Aetna    PT Start Time 0930    PT Stop Time 1015    PT Time Calculation (min) 45 min    Equipment Utilized During Treatment Other (comment)   yellow noodle   Activity Tolerance Patient tolerated treatment well    Behavior During Therapy [facility] for tasks assessed/performed            Past Medical History:  Diagnosis Date   Aortic atherosclerosis (HCC) 06/30/2021   Mitral valve prolapse    Question of MVP in 2017 though looks like they didnt see this on repeat echo according to cards 2021 office note.   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   Myofascial pain 07/25/2022   Fibromyalgia 03/28/2022   Acute gallstone pancreatitis 06/30/2021   Pyuria 06/30/2021   Aortic atherosclerosis (HCC) 06/30/2021   Mitral valve prolapse 03/22/2015   Mitral regurgitation 03/22/2015   Palpitations 03/22/2015   Essential hypertension 03/22/2015   Seasonal and perennial allergic rhinoconjunctivitis 02/23/2015    ONSET DATE: 08/21/2022 (referral date)  REFERRING DIAG: M79.7 (ICD-10-CM) - Fibromyalgia M79.18 (ICD-10-CM) - Myalgia, other site  THERAPY DIAG:  Muscle weakness (generalized)  Other abnormalities of  gait and mobility  Rationale for Evaluation and Treatment: Rehabilitation  SUBJECTIVE:                                                                                                                                                                                             SUBJECTIVE STATEMENT: Patient is having more shoulder pain today partially attributed to cleaning her house yesterday.  Pt accompanied by: self  PERTINENT HISTORY: PMH: Aortic atherosclerosis, mitral valve prolapse, urticaria, essential HTN, fibromylagia, osteopenia after menopause  PAIN:  Are you having pain? Yes: NPRS scale: 4/10 Pain location: bilateral shoulders Pain  description: achy Aggravating factors: general movement Relieving factors: nothing  PRECAUTIONS: None  RED FLAGS: None   WEIGHT BEARING RESTRICTIONS: No  FALLS: Has patient fallen in last 6 months? No  LIVING ENVIRONMENT: Lives with: lives with their spouse Lives in: House/apartment Stairs: Yes: Internal: 14 steps; on right going up Has following equipment at home: None  PLOF: Independent with gait and Independent with transfers  PATIENT GOALS: "to improve pain"  OBJECTIVE:   DIAGNOSTIC FINDINGS: None relevant to this POC  COGNITION: Overall cognitive status: Within functional limits for tasks assessed   SENSATION: Reports no N/T in UE or LE Light touch WFL   EDEMA:  R ankle always swollen since surgery   POSTURE: rounded shoulders and forward head   LOWER EXTREMITY MMT:    MMT Right Eval Left Eval  Hip flexion 5 5  Hip extension    Hip abduction    Hip adduction    Hip internal rotation    Hip external rotation    Knee flexion 5 5  Knee extension 5 5  Ankle dorsiflexion 5 5  Ankle plantarflexion    Ankle inversion    Ankle eversion    (Blank rows = not tested)  BED MOBILITY:  Mod I per pt report  TRANSFERS: Assistive device utilized: None  Sit to stand: Modified independence Stand to sit: Modified  independence Chair to chair: Modified independence Floor:  not assessed at eval   STAIRS: Level of Assistance: Modified independence Stair Negotiation Technique: Alternating Pattern  with Bilateral Rails Number of Stairs: 6  Height of Stairs: 4  Comments: needs BUE support  GAIT: Gait pattern: antalgic Distance walked: various clinic distances Assistive device utilized: None Level of assistance: Modified independence Comments: Needs increased time  FUNCTIONAL TESTS:      PATIENT SURVEYS:  FIQR (Revised Fibromyalgia Impact Questionnaire): 27.8/100  TODAY'S TREATMENT:                                                                                                                              11/22/2022: Aquatic therapy at [facility] - pool temperature 92 degrees   Patient seen for aquatic therapy today.  Treatment took place in water 4' - 4\' 8"  deep.  Patient entered and exited the pool via stairs using bilateral rails and reciprocal pattern at mod I level.   Exercises: Warmup:  water walking forwards, backwards, and laterally 4 x 18 ft in 4 ft water no support IND Reviewed established HEP: Access Code: [email or web address] URL: [email or web address] Date: 11/22/2022 Prepared by: Camille Bal  Exercises - Heel Toe Raises at [facility] x20 w/ 3 second eccentric lower - Standing March 2x20 no UE support - Lunge to Target at [facility] Wall  2x10 alt LE w/ second round focusing on increased front LE bend, pt reports this really helped her stretch her low back - Standing Hip Circles at [facility] Wall  x20 forwards > x20 backwards - Push-Up on Pool Wall  x20 - Flutter Kick at [employer] (showed pt variations in sitting and long-sitting against wall to engage core - this was painful for pt shoulders so not recommended) x3 minutes - Plank with Hip Extension at [employer]  2x20 - Sitting Balance on Pool Noodle (swing sit) (had pt practice seated lateral reach, overhead reach, and  marching, tried to advance to pedaling across pool with pt having difficulty) -Sitting balance on pool noodle (straddle sit) pt paddles forward and backward 3x18 ft each -Reviewed Ai Chi postures 14-16 for form correction and sequencing  Patient requires buoyancy of the water for support for reduced fall risk with gait training and balance exercises at mod I level. Exercises able to be performed safely in water without the risk of fall compared to those same exercises performed on land; viscosity of water needed for resistance for strengthening. Current of water provides perturbations for challenging static and dynamic balance.   PATIENT EDUCATION: Education details: Provided list of community pools, aquatic HEP, and Ai Chi handout.  Discussed plan for PT discharge next visit. Person educated: Patient Education method: Explanation Education comprehension: verbalized understanding  HOME EXERCISE PROGRAM: Access Code: [email or web address] URL: [email or web address] Date: 11/22/2022 Prepared by: Camille Bal  Exercises - Heel Toe Raises at [facility]  - 1 x daily - 7 x weekly - 3 sets - 10 reps - Standing March at [facility]  - 1 x daily - 7 x weekly - 3 sets - 10 reps - Lateral Stepping at Pool Wall  - 1 x daily - 7 x weekly - 3 sets - 10 reps - Forward and Backward Stepping at [employer]  - 1 x daily - 7 x weekly - 3 sets - 10 reps - Lunge to Target at [employer]  - 1 x daily - 7 x weekly - 3 sets - 10 reps - Standing Hip Circles at [employer]  - 1 x daily - 7 x weekly - 3 sets - 10 reps - Push-Up on Pool Wall  - 1 x daily - 7 x weekly - 3 sets - 10 reps - Flutter Kick at [employer]  - 1 x daily - 7 x weekly - 3 sets - 10 reps - Plank with Hip Extension at [employer]  - 1 x daily - 7 x weekly - 3 sets - 10 reps - Sitting Balance on Pool Noodle  - 1 x daily - 7 x weekly - 3 sets - 10 reps  Ai Chi Handout all postures  GOALS: Goals reviewed with patient? Yes  SHORT TERM GOALS: Target  date: 11/02/2022   Pt will be independent with initial aquatic HEP for improved strength, balance, transfers and gait and decreased pain. Baseline:  Initiated Ai Chi, will establish LE and core strength and balance HEP in coming visits (9/26) Goal status: IN PROGRESS  LONG TERM GOALS: Target date: 11/30/2022   Pt will be independent with final aquatic HEP for improved strength, balance, transfers and gait and decreased pain. Baseline:  Goal status: INITIAL  2.  Pt will improve 5 x STS to less than or equal to 13 seconds to demonstrate improved functional strength and transfer efficiency.  Baseline: 16.34 sec (8/30) Goal status: INITIAL  3.  Pt will improve FGA to 28/30 for decreased fall risk  Baseline: 24/30 (8/30) Goal status: INITIAL  4.  Pt will improve her score on the FIQR to 22/100 to demonstrate decreased disability level. Baseline: 27.8/100 (8/30) Goal status: INITIAL  ASSESSMENT:  CLINICAL IMPRESSION: Patient has finished with aquatic therapy and is appropriate for discharge to home management utilizing community pool resources.  She is aware and in agreement to plan for discharge next visit.  Aquatic PT provided Ai Chi and laminated aquatic HEP to supplement this transition.  Pt is independent to modified independent using pool tools. Will assess goals on land and discharge.  OBJECTIVE IMPAIRMENTS: Abnormal gait, decreased activity tolerance, decreased balance, decreased knowledge of condition, and pain.   ACTIVITY LIMITATIONS: carrying, lifting, bending, sitting, standing, sleeping, stairs, transfers, and hygiene/grooming  PARTICIPATION LIMITATIONS: meal prep, cleaning, laundry, interpersonal relationship, driving, shopping, and community activity  PERSONAL FACTORS: Time since onset of injury/illness/exacerbation and 1-2 comorbidities:    Aortic atherosclerosis, mitral valve prolapse, urticaria, essential HTN, fibromylagia, osteopenia after menopauseare also  affecting patient's functional outcome.   REHAB POTENTIAL: Fair time since onset, has tried PT before with no changes in symptoms (has not tried aquatic therapy before)  CLINICAL DECISION MAKING: Stable/uncomplicated  EVALUATION COMPLEXITY: Low  PLAN:  PT FREQUENCY: 1x/week  PT DURATION: 6 weeks  PLANNED INTERVENTIONS: Therapeutic exercises, Therapeutic activity, Neuromuscular re-education, Balance training, Gait training, Patient/Family education, Self Care, Joint mobilization, Stair training, Aquatic Therapy, Dry Needling, Electrical stimulation, Cryotherapy, Moist heat, Taping, Manual therapy, and Re-evaluation  PLAN FOR NEXT SESSION: ASSESS LTGs-D/C!  Camille Bal, PT, DPT  11/22/2022, 10:24 AM

## 2022-11-22 NOTE — Patient Instructions (Signed)
Access Code: [email or web address] URL: [email or web address] Date: 11/22/2022 Prepared by: Camille Bal  Exercises - Heel Toe Raises at [facility]  - 1 x daily - 7 x weekly - 3 sets - 10 reps - Standing March at [facility]  - 1 x daily - 7 x weekly - 3 sets - 10 reps - Lateral Stepping at Pool Wall  - 1 x daily - 7 x weekly - 3 sets - 10 reps - Forward and Backward Stepping at [employer]  - 1 x daily - 7 x weekly - 3 sets - 10 reps - Lunge to Target at [employer]  - 1 x daily - 7 x weekly - 3 sets - 10 reps - Standing Hip Circles at [employer]  - 1 x daily - 7 x weekly - 3 sets - 10 reps - Push-Up on Pool Wall  - 1 x daily - 7 x weekly - 3 sets - 10 reps - Flutter Kick at [employer]  - 1 x daily - 7 x weekly - 3 sets - 10 reps - Plank with Hip Extension at [employer]  - 1 x daily - 7 x weekly - 3 sets - 10 reps - Sitting Balance on Pool Noodle  - 1 x daily - 7 x weekly - 3 sets - 10 reps

## 2022-11-29 ENCOUNTER — Ambulatory Visit: Payer: No Typology Code available for payment source | Admitting: Physical Therapy

## 2022-11-29 DIAGNOSIS — M6281 Muscle weakness (generalized): Secondary | ICD-10-CM

## 2022-11-29 DIAGNOSIS — M797 Fibromyalgia: Secondary | ICD-10-CM

## 2022-11-29 DIAGNOSIS — R2689 Other abnormalities of gait and mobility: Secondary | ICD-10-CM

## 2022-11-29 NOTE — Therapy (Signed)
OUTPATIENT PHYSICAL THERAPY NEURO DISCHARGE   Patient Name: Tiffany Cunningham MRN: [id number] DOB:09-May-1955, 67 y.o., female Today's Date: 11/29/2022  PHYSICAL THERAPY DISCHARGE SUMMARY  Visits from Start of Care: 7  Current functional level related to goals / functional outcomes: Independent   Remaining deficits: Ongoing chronic pain due to fibromyalgia   Education / Equipment: Aquatic HEP   Patient agrees to discharge. Patient goals were partially met. Patient is being discharged due to being pleased with the current functional level.   PCP: Ladora Daniel, PA-C REFERRING PROVIDER: Ladora Daniel, PA-C  END OF SESSION:   PT End of Session - 11/29/22 1146     Visit Number 7    Number of Visits 7   with eval   Date for PT Re-Evaluation 11/30/22    Authorization Type [employer]    PT Start Time 1147    PT Stop Time 1212    PT Time Calculation (min) 25 min    Equipment Utilized During Treatment Gait belt   yellow noodle   Activity Tolerance Patient tolerated treatment well    Behavior During Therapy WFL for tasks assessed/performed             Past Medical History:  Diagnosis Date   Aortic atherosclerosis (HCC) 06/30/2021   Mitral valve prolapse    Question of MVP in 2017 though looks like they didnt see this on repeat echo according to cards 2021 office note.   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   Myofascial pain 07/25/2022   Fibromyalgia 03/28/2022   Acute gallstone pancreatitis 06/30/2021   Pyuria 06/30/2021   Aortic atherosclerosis (HCC) 06/30/2021   Mitral  valve prolapse 03/22/2015   Mitral regurgitation 03/22/2015   Palpitations 03/22/2015   Essential hypertension 03/22/2015   Seasonal and perennial allergic rhinoconjunctivitis 02/23/2015    ONSET DATE: 08/21/2022 (referral date)  REFERRING DIAG: M79.7 (ICD-10-CM) - Fibromyalgia M79.18 (ICD-10-CM) - Myalgia, other site  THERAPY DIAG:  Muscle weakness (generalized)  Other abnormalities of gait and mobility  Fibromyalgia  Rationale for Evaluation and Treatment: Rehabilitation  SUBJECTIVE:  SUBJECTIVE STATEMENT: She feels her pain is "The same as it always is". Is looking for a pool to join to complete HEP, enjoyed aquatic therapy. "In the water I can play with my grandchildren, outside I cannot"  Pt accompanied by: self  PERTINENT HISTORY: PMH: Aortic atherosclerosis, mitral valve prolapse, urticaria, essential HTN, fibromylagia, osteopenia after menopause  PAIN:  Are you having pain? Yes: NPRS scale: 4/10 Pain location: bilateral shoulders, low back, both hips (worst), wrists, fingers and thumb Pain description: achy Aggravating factors: getting up from sitting, walking, inactive then moving, standing >1 hr Relieving factors: being in the pool, water therapy  PRECAUTIONS: None  RED FLAGS: None   WEIGHT BEARING RESTRICTIONS: No  FALLS: Has patient fallen in last 6 months? No  LIVING ENVIRONMENT: Lives with: lives with their spouse Lives in: House/apartment Stairs: Yes: Internal: 14 steps; on right going up Has following equipment at home: None  PLOF: Independent with gait and Independent with transfers  PATIENT GOALS: "to improve pain"  OBJECTIVE:   DIAGNOSTIC FINDINGS: None relevant to this POC  COGNITION: Overall cognitive status: Within functional limits for tasks  assessed   SENSATION: Reports no N/T in UE or LE Light touch WFL   EDEMA:  R ankle always swollen since surgery   POSTURE: rounded shoulders and forward head   LOWER EXTREMITY MMT:    MMT Right Eval Left Eval  Hip flexion 5 5  Hip extension    Hip abduction    Hip adduction    Hip internal rotation    Hip external rotation    Knee flexion 5 5  Knee extension 5 5  Ankle dorsiflexion 5 5  Ankle plantarflexion    Ankle inversion    Ankle eversion    (Blank rows = not tested)  BED MOBILITY:  Mod I per pt report  TRANSFERS: Assistive device utilized: None  Sit to stand: Modified independence Stand to sit: Modified independence Chair to chair: Modified independence Floor:  not assessed at eval   STAIRS: Level of Assistance: Modified independence Stair Negotiation Technique: Alternating Pattern  with Bilateral Rails Number of Stairs: 6  Height of Stairs: 4  Comments: needs BUE support  GAIT: Gait pattern: antalgic Distance walked: various clinic distances Assistive device utilized: None Level of assistance: Modified independence Comments: Needs increased time  FUNCTIONAL TESTS:    [facility] PT Assessment - 11/29/22 1158       Standardized Balance Assessment   Five times sit to stand comments  13.06 sec   no UE support     Functional Gait  Assessment   Gait assessed  Yes    Gait Level Surface Walks 20 ft in less than 5.5 sec, no assistive devices, good speed, no evidence for imbalance, normal gait pattern, deviates no more than 6 in outside of the 12 in walkway width.    Change in Gait Speed Able to smoothly change walking speed without loss of balance or gait deviation. Deviate no more than 6 in outside of the 12 in walkway width.    Gait with Horizontal Head Turns Performs head turns smoothly with no change in gait. Deviates no more than 6 in outside 12 in walkway width    Gait with Vertical Head Turns Performs head turns with no change in gait. Deviates no  more than 6 in outside 12 in walkway width.    Gait and Pivot Turn Pivot turns safely within 3 sec and stops quickly with no loss of balance.  2.44 sec   Step Over Obstacle Is able to step over one shoe box (4.5 in total height) without changing gait speed. No evidence of imbalance.    Gait with Narrow Base of Support Is able to ambulate for 10 steps heel to toe with no staggering.    Gait with Eyes Closed Walks 20 ft, uses assistive device, slower speed, mild gait deviations, deviates 6-10 in outside 12 in walkway width. Ambulates 20 ft in less than 9 sec but greater than 7 sec.    Ambulating Backwards Walks 20 ft, uses assistive device, slower speed, mild gait deviations, deviates 6-10 in outside 12 in walkway width.    Steps Alternating feet, must use rail.    Total Score 26    FGA comment: 26/30              PATIENT SURVEYS:  FIQR (Revised Fibromyalgia Impact Questionnaire): 27.8/100  TODAY'S TREATMENT:                                                                                                                              11/29/2022: [facility]  [facility] PT Assessment - 11/29/22 1158       Standardized Balance Assessment   Five times sit to stand comments  13.06 sec   no UE support     Functional Gait  Assessment   Gait assessed  Yes    Gait Level Surface Walks 20 ft in less than 5.5 sec, no assistive devices, good speed, no evidence for imbalance, normal gait pattern, deviates no more than 6 in outside of the 12 in walkway width.    Change in Gait Speed Able to smoothly change walking speed without loss of balance or gait deviation. Deviate no more than 6 in outside of the 12 in walkway width.    Gait with Horizontal Head Turns Performs head turns smoothly with no change in gait. Deviates no more than 6 in outside 12 in walkway width    Gait with Vertical Head Turns Performs head turns with no change in gait. Deviates no more than 6 in outside 12 in walkway width.    Gait and  Pivot Turn Pivot turns safely within 3 sec and stops quickly with no loss of balance.   2.44 sec   Step Over Obstacle Is able to step over one shoe box (4.5 in total height) without changing gait speed. No evidence of imbalance.    Gait with Narrow Base of Support Is able to ambulate for 10 steps heel to toe with no staggering.    Gait with Eyes Closed Walks 20 ft, uses assistive device, slower speed, mild gait deviations, deviates 6-10 in outside 12 in walkway width. Ambulates 20 ft in less than 9 sec but greater than 7 sec.    Ambulating Backwards Walks 20 ft, uses assistive device, slower speed, mild gait deviations, deviates 6-10 in outside 12 in walkway width.    Steps Alternating feet,  must use rail.    Total Score 26    FGA comment: 26/30             Pt Survey: FIQR 40/100   GOALS: Goals reviewed with patient? Yes  SHORT TERM GOALS: Target date: 11/02/2022   Pt will be independent with initial aquatic HEP for improved strength, balance, transfers and gait and decreased pain. Baseline:  Initiated [place], will establish LE and core strength and balance HEP in coming visits (9/26) Goal status: IN PROGRESS  LONG TERM GOALS: Target date: 11/30/2022   Pt will be independent with final aquatic HEP for improved strength, balance, transfers and gait and decreased pain. Baseline: given at last aquatic session Goal status: MET  2.  Pt will improve 5 x STS to less than or equal to 13 seconds to demonstrate improved functional strength and transfer efficiency.  Baseline: 16.34 sec (8/30), 13.06 sec(11/29/22)  Goal status: MET  3.  Pt will improve FGA to 28/30 for decreased fall risk  Baseline: 24/30 (8/30), 26/30 Goal status: NOT MET  4.  Pt will improve her score on the FIQR to 22/100 to demonstrate decreased disability level. Baseline: 27.8/100 (8/30), 40/100 (11/29/22) Goal status: NOT MET   ASSESSMENT:  CLINICAL IMPRESSION: Emphasis of skilled PT session on assessing  LTGs for discharge. Pt has met 2/4 LTGs and not met 2/4 LTGs. This date, pt has improved 5x STS time to 13.06 seconds, improved FGA to 26/30, and has a good aquatic HEP with plans to join a pool. Pt's FIQR score was worse this date at 40/100, but pt does note she is having a day of increased pain and symptoms w/ her fibromyalgia diagnosis. Pt is ready to discharge, continue with HEP.  OBJECTIVE IMPAIRMENTS: Abnormal gait, decreased activity tolerance, decreased balance, decreased knowledge of condition, and pain.   ACTIVITY LIMITATIONS: carrying, lifting, bending, sitting, standing, sleeping, stairs, transfers, and hygiene/grooming  PARTICIPATION LIMITATIONS: meal prep, cleaning, laundry, interpersonal relationship, driving, shopping, and community activity  PERSONAL FACTORS: Time since onset of injury/illness/exacerbation and 1-2 comorbidities:    Aortic atherosclerosis, mitral valve prolapse, urticaria, essential HTN, fibromylagia, osteopenia after menopauseare also affecting patient's functional outcome.   REHAB POTENTIAL: Fair time since onset, has tried PT before with no changes in symptoms (has not tried aquatic therapy before)  CLINICAL DECISION MAKING: Stable/uncomplicated  EVALUATION COMPLEXITY: Low    Beverely Low, SPT   11/29/2022, 12:51 PM

## 2023-01-03 ENCOUNTER — Emergency Department (HOSPITAL_BASED_OUTPATIENT_CLINIC_OR_DEPARTMENT_OTHER)
Admission: EM | Admit: 2023-01-03 | Discharge: 2023-01-03 | Disposition: A | Discharge status: Home / Self Care | Payer: No Typology Code available for payment source | Attending: Emergency Medicine | Admitting: Emergency Medicine

## 2023-01-03 ENCOUNTER — Emergency Department (HOSPITAL_BASED_OUTPATIENT_CLINIC_OR_DEPARTMENT_OTHER): Payer: No Typology Code available for payment source | Admitting: Radiology

## 2023-01-03 ENCOUNTER — Other Ambulatory Visit: Payer: Self-pay

## 2023-01-03 DIAGNOSIS — S2231XA Fracture of one rib, right side, initial encounter for closed fracture: Secondary | ICD-10-CM | POA: Insufficient documentation

## 2023-01-03 DIAGNOSIS — S29001A Unspecified injury of muscle and tendon of front wall of thorax, initial encounter: Secondary | ICD-10-CM | POA: Diagnosis present

## 2023-01-03 DIAGNOSIS — W1839XA Other fall on same level, initial encounter: Secondary | ICD-10-CM | POA: Diagnosis not present

## 2023-01-03 DIAGNOSIS — Y9389 Activity, other specified: Secondary | ICD-10-CM | POA: Diagnosis not present

## 2023-01-03 NOTE — Discharge Instructions (Addendum)
You were seen in the ER today after a fall. Your shoulder xray is negative for any fracture, but your chest xray shows a nondisplaced fracture of the lateral right 5th rib. Management for these is primarily through pain control and use of an incentive spirometer to reduce the risk of pneumonia from developing. If any new symptoms arise, please return to the ER. Otherwise follow up with your primary care provider.

## 2023-01-03 NOTE — ED Provider Notes (Signed)
[place] EMERGENCY DEPARTMENT AT [facility] Provider Note   CSN: [id number] Arrival date & time: 01/03/23  1535     History Chief Complaint  Patient presents with   Tiffany Cunningham is a 67 y.o. female.  Patient past history significant for fibromyalgia, polyarticular arthritis presents to the ED with fall.  Patient reportedly was playing with grandchildren when she fell on her right side.  States that she landed on her right ribs and right shoulder and endorsing pain in these areas.  Takes diclofenac for pain but soaks with significant pain at this time.  Denies any shortness of breath or wheezing.  Tenderness is primarily to palpation over the area.  No history of any pulmonary conditions such as COPD, asthma or lung cancer.   Fall       Home Medications Prior to Admission medications   Medication Sig Start Date End Date Taking? Authorizing Provider  amLODipine (NORVASC) 5 MG tablet Take 5 mg by mouth daily. 04/30/21   [provider]  Azelastine-Fluticasone 137-50 MCG/ACT SUSP Place 1 spray into the nose 2 (two) times daily as needed. Patient taking differently: Place 1 spray into the nose 2 (two) times daily as needed (allergies). 06/12/21   Ellamae Sia, DO  Cholecalciferol (VITAMIN D3) 25 MCG (1000 UT) capsule Take 1,000 Units by mouth daily. softgels    [provider]  diclofenac (VOLTAREN) 75 MG EC tablet Take 75 mg by mouth 2 (two) times daily. 06/02/21   [provider]  DULoxetine (CYMBALTA) 30 MG capsule TAKE 1 CAPSULE BY MOUTH EVERY DAY 10/15/22   Ranelle Oyster, MD  DULoxetine (CYMBALTA) 60 MG capsule Take 60 mg by mouth daily. 10/23/19   [provider]  gabapentin (NEURONTIN) 300 MG capsule Take 1 capsule (300 mg total) by mouth 3 (three) times daily. 05/30/22   Ranelle Oyster, MD  losartan-hydrochlorothiazide (HYZAAR) 100-25 MG tablet Take 1 tablet by mouth daily.    [provider]  Magnesium Oxide 250  MG TABS Take 250 mg by mouth at bedtime.    [provider]  Multiple Vitamin (MULTIVITAMIN WITH MINERALS) TABS tablet Take 1 tablet by mouth daily.    [provider]  Omega-3 Fatty Acids (FISH OIL) 1000 MG CAPS Take 3 capsules by mouth daily.    [provider]  Polyethyl Glycol-Propyl Glycol (SYSTANE OP) Place 1 drop into both eyes in the morning and at bedtime.    [provider]  Potassium 99 MG TABS daily at 6 (six) AM.    [provider]  Potassium Gluconate 550 (90 K) MG TABS Take 1 tablet by mouth daily.    [provider]  pregabalin (LYRICA) 50 MG capsule Take 1 capsule (50 mg total) by mouth 3 (three) times daily. 10/24/22   Ranelle Oyster, MD  tiZANidine (ZANAFLEX) 4 MG tablet TAKE 1-1.5 TABLETS (4-6 MG TOTAL) BY MOUTH AT BEDTIME. 08/27/22   Ranelle Oyster, MD      Allergies    Ace inhibitors    Review of Systems   Review of Systems  Musculoskeletal:        Chest wall pain  All other systems reviewed and are negative.   Physical Exam Updated Vital Signs BP (!) 142/65   Pulse 68   Temp 97.8 F (36.6 C) (Oral)   Resp 18   Ht 5\' 4"  (1.626 m)   Wt 74.8 kg   SpO2 99%  BMI 28.32 kg/m  Physical Exam Vitals and nursing note reviewed.  Constitutional:      General: She is not in acute distress.    Appearance: She is well-developed.  HENT:     Head: Normocephalic and atraumatic.  Eyes:     Conjunctiva/sclera: Conjunctivae normal.  Cardiovascular:     Rate and Rhythm: Normal rate and regular rhythm.     Heart sounds: No murmur heard. Pulmonary:     Effort: Pulmonary effort is normal. No respiratory distress.     Breath sounds: Normal breath sounds.     Comments: CTAB. No pain with deep inhalation. Abdominal:     Palpations: Abdomen is soft.     Tenderness: There is no abdominal tenderness.  Musculoskeletal:        General: Tenderness and signs of injury present. No swelling or deformity.       Arms:      Cervical back: Neck supple.     Comments: TTP along the lateral aspect of the right ribs. No obvious bony deformity and no bruising seen at this time.  Skin:    General: Skin is warm and dry.     Capillary Refill: Capillary refill takes less than 2 seconds.  Neurological:     Mental Status: She is alert.  Psychiatric:        Mood and Affect: Mood normal.     ED Results / Procedures / Treatments   Labs (all labs ordered are listed, but only abnormal results are displayed) Labs Reviewed - No data to display  EKG None  Radiology DG Ribs Unilateral W/Chest Right  Result Date: 01/03/2023 CLINICAL DATA:  Fall.  Right chest wall pain. EXAM: RIGHT RIBS AND CHEST - 3+ VIEW COMPARISON:  09/22/2008. FINDINGS: Nondisplaced fracture of the lateral right fifth rib. No other fractures.  No bone lesions. Cardiac silhouette normal in size configuration. Normal mediastinal and hilar contours. Clear lungs.  No pleural effusion or pneumothorax. IMPRESSION: 1. Nondisplaced fracture of the lateral right fifth rib. 2. No pneumothorax or pleural effusion. Electronically Signed   By: Amie Portland M.D.   On: 01/03/2023 16:07   DG Shoulder Right  Result Date: 01/03/2023 CLINICAL DATA:  Fall.  Right shoulder pain. EXAM: RIGHT SHOULDER - 2+ VIEW COMPARISON:  None Available. FINDINGS: There is no evidence of fracture or dislocation. There is no evidence of arthropathy or other focal bone abnormality. Soft tissues are unremarkable. IMPRESSION: Negative. Electronically Signed   By: Amie Portland M.D.   On: 01/03/2023 16:05    Procedures Procedures   Medications Ordered in ED Medications - No data to display  ED Course/ Medical Decision Making/ A&P                               Medical Decision Making Amount and/or Complexity of Data Reviewed Radiology: ordered.   This patient presents to the ED for concern of fall.  Differential diagnosis includes shoulder dislocation, rib fracture, pneumothorax,  flail chest   Imaging Studies ordered:  I ordered imaging studies including x-ray right shoulder, x-ray right ribs I independently visualized and interpreted imaging which showed no evidence of any shoulder injury or dislocation, nondisplaced right rib fracture along the lateral aspect of the right fifth rib I agree with the radiologist interpretation   Problem List / ED Course:  Patient with past history significant for fibromyalgia, polyarticular arthritis presents to the emergency department concerns of a fall.  Endorsing pain primarily to the right ribs/right shoulder following this fall.  States that this is a mechanical fall in which she tripped over herself and landed on the right.  Denies any chest pain or shortness of breath.  Significant tenderness to palpation.  No obvious visible deformity no bruising seen.  Has not taken any medications prior to arriving for pain. Given significant TTP, will evaluate with xray imaging for further assessment of pain. Declining pain medication at this time. Xray of right shoulder is unremarkable. Xray of right ribs is concerning for a lateral right 5th rib fracture, nondisplaced. Clinically this appears correct given TTP over this area although no bruising seen. Given finding and isolated rib injury, will provide with incentive spirometer to reduce the risk of rib fracture sequela such as pneumonia. No acute indication for prophylactic initiation of antibiotics at this time given no history of pulmonary conditions such as asthma, COPD, or any other conditions. Patient is comfortable with current plan and verbalized understanding return precautions. Patient discharged home in stable condition with instructions to follow up with PCP for further evaluation.   Final Clinical Impression(s) / ED Diagnoses Final diagnoses:  Closed fracture of one rib of right side, initial encounter    Rx / DC Orders ED Discharge Orders     None         Smitty Knudsen, PA-C 01/03/23 1721    Melene Plan, DO 01/03/23 1858

## 2023-01-03 NOTE — ED Triage Notes (Signed)
PT POV ambulatory to triage after falling while playing with grand kids. Fell on right side, c/o R shoulder pain

## 2023-01-03 NOTE — ED Notes (Signed)
RT Note: Patient educated on  the use of an incentive spirometer

## 2023-01-23 ENCOUNTER — Encounter: Payer: Self-pay | Admitting: Physical Medicine & Rehabilitation

## 2023-01-23 ENCOUNTER — Encounter
Payer: No Typology Code available for payment source | Attending: Physical Medicine & Rehabilitation | Admitting: Physical Medicine & Rehabilitation

## 2023-01-23 DIAGNOSIS — M797 Fibromyalgia: Secondary | ICD-10-CM | POA: Diagnosis not present

## 2023-01-23 MED ORDER — PREGABALIN 100 MG PO CAPS: 100.0000 mg | ORAL_CAPSULE | Freq: Three times a day (TID) | ORAL | 4 refills | Status: DC

## 2023-01-23 NOTE — Patient Instructions (Addendum)
ALWAYS FEEL FREE TO CALL OUR OFFICE WITH ANY PROBLEMS OR QUESTIONS [phone])  **PLEASE NOTE** ALL MEDICATION REFILL REQUESTS (INCLUDING CONTROLLED SUBSTANCES) NEED TO BE MADE AT LEAST 7 DAYS PRIOR TO REFILL BEING DUE. ANY REFILL REQUESTS INSIDE THAT TIME FRAME MAY RESULT IN DELAYS IN RECEIVING YOUR PRESCRIPTION.    !!!!!!!HAPPY HOLIDAYS!!!!!!   LYRICA 100MG    DAYS 1-5: 50-50-100 DAYS 6-10 100-50-100 DAYS 11+ 100MG  THREE X DAILY

## 2023-01-23 NOTE — Progress Notes (Signed)
Subjective:    Patient ID: Tiffany Cunningham, female    DOB: 1955/08/03, 67 y.o.   MRN: [id number]  HPI  Tiffany Cunningham is here in follow up of her chronic pain. She fell on 11/28 while playing with her kids. She landed on er right side and broke her fifth rib. She is also has right shoulder and hip pain. She is seeing ortho for the hip. MRI is scheduled of the hip Monday.  She is still managed to stay upright and active to a degree.  However she is unable to walk as far she once did, only being able to last for about a mile.  Prior to the fall she had been pushing herself to stay active because she knew the importance of  exercise despite the fact that she was having pain.  At her last visit we transition from gabapentin to Lyrica.  She is taking 50 mg 3 times daily.  She is unsure if any benefit thus far.  He also increased her Cymbalta up to 90 mg and she noticed no benefit with this and when the prescription she ran out she moved back just to the 60 mg.  She continues on diclofenac 75 mg twice daily per baseline.  Pain Inventory Average Pain 4 Pain Right Now 5 My pain is constant, burning, dull, and aching  In the last 24 hours, has pain interfered with the following? General activity 5 Relation with others 0 Enjoyment of life 5 What TIME of day is your pain at its worst? morning , daytime, evening, and night Sleep (in general) Poor  Pain is worse with: walking, sitting, inactivity, standing, and some activites Pain improves with: medication Relief from Meds: 2  Family History  Problem Relation Age of Onset   Allergic rhinitis Mother    Cancer Father    Clotting disorder Father    Allergic rhinitis Sister    Asthma Sister    Eczema Daughter    Asthma Cousin    Urticaria Neg Hx    Immunodeficiency Neg Hx    Social History   Socioeconomic History   Marital status: Married    Spouse name: Not on file   Number of children: Not on file   Years of education: Not on file   Highest  education level: Not on file  Occupational History   Not on file  Tobacco Use   Smoking status: Never   Smokeless tobacco: Never  Vaping Use   Vaping status: Never Used  Substance and Sexual Activity   Alcohol use: No    Alcohol/week: 0.0 standard drinks of alcohol   Drug use: No   Sexual activity: Not on file  Other Topics Concern   Not on file  Social History Narrative   Not on file   Social Drivers of Health   Financial Resource Strain: Low Risk  (08/14/2022)   Received from [facility]   Overall Financial Resource Strain (CARDIA)    Difficulty of Paying Living Expenses: Not hard at all  Food Insecurity: No Food Insecurity (08/14/2022)   Received from [facility]   Hunger Vital Sign    Worried About Running Out of Food in the Last Year: Never true    Ran Out of Food in the Last Year: Never true  Transportation Needs: No Transportation Needs (08/14/2022)   Received from [facility] - Transportation    Lack of Transportation (Medical): No    Lack of Transportation (Non-Medical): No  Physical  Activity: Sufficiently Active (08/14/2022)   Received from [facility]   Exercise Vital Sign    Days of Exercise per Week: 6 days    Minutes of Exercise per Session: 30 min  Stress: No Stress Concern Present (08/14/2022)   Received from [facility] - Occupational Stress Questionnaire    Feeling of Stress : Not at all  Social Connections: Socially Integrated (08/14/2022)   Received from [facility]   Social Network    How would you rate your social network (family, work, friends)?: Good participation with social networks   Past Surgical History:  Procedure Laterality Date   APPENDECTOMY  02/05/1977   CHOLECYSTECTOMY N/A 07/01/2021   Procedure: LAPAROSCOPIC CHOLECYSTECTOMY WITH INTRAOPERATIVE CHOLANGIOGRAM;  Surgeon: Almond Lint, MD;  Location: [facility];  Service: General;  Laterality: N/A;   HIP SURGERY Left    OSTEOTOMY   02/06/1991   SHOULDER SURGERY Left    Past Surgical History:  Procedure Laterality Date   APPENDECTOMY  02/05/1977   CHOLECYSTECTOMY N/A 07/01/2021   Procedure: LAPAROSCOPIC CHOLECYSTECTOMY WITH INTRAOPERATIVE CHOLANGIOGRAM;  Surgeon: Almond Lint, MD;  Location: [facility];  Service: General;  Laterality: N/A;   HIP SURGERY Left    OSTEOTOMY  02/06/1991   SHOULDER SURGERY Left    Past Medical History:  Diagnosis Date   Aortic atherosclerosis (HCC) 06/30/2021   Mitral valve prolapse    Question of MVP in 2017 though looks like they didnt see this on repeat echo according to cards 2021 office note.   Urticaria    BP 131/73   Pulse 80   Ht 5\' 4"  (1.626 m)   Wt 173 lb (78.5 kg)   SpO2 97%   BMI 29.70 kg/m   Opioid Risk Score:   Fall Risk Score:  `1  Depression screen PHQ 2/9     10/24/2022    1:00 PM 03/28/2022    1:40 PM  Depression screen PHQ 2/9  Decreased Interest 0 0  Down, Depressed, Hopeless 0 0  PHQ - 2 Score 0 0  Altered sleeping  3  Tired, decreased energy  2  Feeling bad or failure about yourself   0  Trouble concentrating  0  Moving slowly or fidgety/restless  0  Suicidal thoughts  0  PHQ-9 Score  5  Difficult doing work/chores  Somewhat difficult     Review of Systems  Musculoskeletal:  Positive for back pain.       Right shoulder pain Bilateral elbow pain  All other systems reviewed and are negative.     Objective:   Physical Exam  General: No acute distress HEENT: NCAT, EOMI, oral membranes moist Cards: reg rate  Chest: normal effort Abdomen: Soft, NT, ND Skin: dry, intact Extremities: no edema Psych: pleasant and appropriate  Skin: intact Neuro: Sensory exam normal for light touch and pain in all 4 limbs. No limb ataxia or cerebellar signs. No abnormal tone appreciated.     Musculoskeletal: right shoulder pain with impingement testing. Right hip tender along lateral aspect. Some antalgia with R WB.                         Assessment & Plan:  Chronic pain syndrome most consistent with fibromyalgia.    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  She has had numerous surgeries involving the left shoulder hip and knee over the last few years Recent fall with  right shoulder and hip injuries/exacerbation       Plan: Maintain cymbalta 60mg  daily to better capture generalized pain.  Maintain activity as tolerated Continue tizanidine   4-6 mg at night.  Increase lyrica to 100mg  tid, titration provided Diclofenac as scheduled Supplements : fish oil, vitamin D, magnesium.  Continue coenzyme Q 10 and turmeric/  -tart cherry extract for joint pain 7.  Ortho follow up for right shoulder and hip 8. Might be a candidate for tramadol trial    About 20 + minutes of time was spent today with the patient in discussion and assessment of her pain and coming up with a custom treatment plan.  I will see her back in about 3 months time.

## 2023-02-20 ENCOUNTER — Other Ambulatory Visit: Payer: Self-pay | Admitting: Physical Medicine & Rehabilitation

## 2023-02-20 DIAGNOSIS — M797 Fibromyalgia: Secondary | ICD-10-CM

## 2023-04-24 ENCOUNTER — Encounter: Payer: Self-pay | Admitting: Physical Medicine & Rehabilitation

## 2023-04-24 ENCOUNTER — Encounter
Payer: No Typology Code available for payment source | Attending: Physical Medicine & Rehabilitation | Admitting: Physical Medicine & Rehabilitation

## 2023-04-24 VITALS — BP 130/83 | HR 85 | Ht 64.0 in | Wt 175.8 lb

## 2023-04-24 DIAGNOSIS — K219 Gastro-esophageal reflux disease without esophagitis: Secondary | ICD-10-CM | POA: Diagnosis not present

## 2023-04-24 DIAGNOSIS — M130 Polyarthritis, unspecified: Secondary | ICD-10-CM | POA: Insufficient documentation

## 2023-04-24 DIAGNOSIS — M797 Fibromyalgia: Secondary | ICD-10-CM | POA: Diagnosis present
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
## 2023-04-24 NOTE — Patient Instructions (Addendum)
 ALWAYS FEEL FREE TO CALL OUR OFFICE WITH ANY PROBLEMS OR QUESTIONS [phone])  **PLEASE NOTE** ALL MEDICATION REFILL REQUESTS (INCLUDING CONTROLLED SUBSTANCES) NEED TO BE MADE AT LEAST 7 DAYS PRIOR TO REFILL BEING DUE. ANY REFILL REQUESTS INSIDE THAT TIME FRAME MAY RESULT IN DELAYS IN RECEIVING YOUR PRESCRIPTION.     DECREASE LYRICA TO TWICE DAILY FOR ONE WEEK THEN AT BEDTIME FOR ONE WEEK, THEN OFF.

## 2023-04-25 ENCOUNTER — Encounter: Payer: Self-pay | Admitting: Physical Medicine & Rehabilitation

## 2023-04-25 LAB — BASIC METABOLIC PANEL
BUN/Creatinine Ratio: 29 — ABNORMAL HIGH (ref 12–28)
BUN: 20 mg/dL (ref 8–27)
CO2: 27 mmol/L (ref 20–29)
Calcium: 9.5 mg/dL (ref 8.7–10.3)
Chloride: 101 mmol/L (ref 96–106)
Creatinine, Ser: 0.68 mg/dL (ref 0.57–1.00)
Glucose: 92 mg/dL (ref 70–99)
Potassium: 4.4 mmol/L (ref 3.5–5.2)
Sodium: 143 mmol/L (ref 134–144)
eGFR: 96 mL/min/{1.73_m2} (ref >59)

## 2023-04-25 LAB — CBC
Hematocrit: 37.9 % (ref 34.0–46.6)
Hemoglobin: 12.7 g/dL (ref 11.1–15.9)
MCH: 29.9 pg (ref 26.6–33.0)
MCHC: 33.5 g/dL (ref 31.5–35.7)
MCV: 89 fL (ref 79–97)
Platelets: 310 10*3/uL (ref 150–450)
RBC: 4.25 x10E6/uL (ref 3.77–5.28)
RDW: 12.1 % (ref 11.7–15.4)
WBC: 6.3 10*3/uL (ref 3.4–10.8)

## 2023-08-03 ENCOUNTER — Other Ambulatory Visit: Payer: Self-pay | Admitting: Physical Medicine & Rehabilitation

## 2023-08-03 DIAGNOSIS — M797 Fibromyalgia: Secondary | ICD-10-CM

## 2023-08-30 ENCOUNTER — Telehealth: Payer: Self-pay | Admitting: *Deleted

## 2023-08-30 NOTE — Telephone Encounter (Signed)
 Pt has been scheduled tele preop appt 09/02/23, ok per Orren Fabry, PAC add on due to procedure date. Med rec and consent are done.      Patient Consent for Virtual Visit        Tiffany Cunningham has provided verbal consent on 08/30/2023 for a virtual visit (video or telephone).   CONSENT FOR VIRTUAL VISIT FOR:  Tiffany Cunningham Tiffany Cunningham  By participating in this virtual visit I agree to the following:  I hereby voluntarily request, consent and authorize [facility] and its employed or contracted physicians, physician assistants, nurse practitioners or other licensed health care professionals (the Practitioner), to provide me with telemedicine health care services (the "Services) as deemed necessary by the treating Practitioner. I acknowledge and consent to receive the Services by the Practitioner via telemedicine. I understand that the telemedicine visit will involve communicating with the Practitioner through live audiovisual communication technology and the disclosure of certain medical information by electronic transmission. I acknowledge that I have been given the opportunity to request an in-person assessment or other available alternative prior to the telemedicine visit and am voluntarily participating in the telemedicine visit.  I understand that I have the right to withhold or withdraw my consent to the use of telemedicine in the course of my care at any time, without affecting my right to future care or treatment, and that the Practitioner or I may terminate the telemedicine visit at any time. I understand that I have the right to inspect all information obtained and/or recorded in the course of the telemedicine visit and may receive copies of available information for a reasonable fee.  I understand that some of the potential risks of receiving the Services via telemedicine include:  Delay or interruption in medical evaluation due to technological equipment failure or disruption; Information  transmitted may not be sufficient (e.g. poor resolution of images) to allow for appropriate medical decision making by the Practitioner; and/or  In rare instances, security protocols could fail, causing a breach of personal health information.  Furthermore, I acknowledge that it is my responsibility to provide information about my medical history, conditions and care that is complete and accurate to the best of my ability. I acknowledge that Practitioner's advice, recommendations, and/or decision may be based on factors not within their control, such as incomplete or inaccurate data provided by me or distortions of diagnostic images or specimens that may result from electronic transmissions. I understand that the practice of medicine is not an exact science and that Practitioner makes no warranties or guarantees regarding treatment outcomes. I acknowledge that a copy of this consent can be made available to me via my patient portal [facility] MyChart), or I can request a printed copy by calling the office of [facility].    I understand that my insurance will be billed for this visit.   I have read or had this consent read to me. I understand the contents of this consent, which adequately explains the benefits and risks of the Services being provided via telemedicine.  I have been provided ample opportunity to ask questions regarding this consent and the Services and have had my questions answered to my satisfaction. I give my informed consent for the services to be provided through the use of telemedicine in my medical care

## 2023-08-30 NOTE — Telephone Encounter (Signed)
   Name: Tiffany Cunningham  DOB: [birth year]  MRN: [id number]  Primary Cardiologist: Oneil Parchment, MD   Preoperative team, please contact this patient and set up a phone call appointment for further preoperative risk assessment. Please obtain consent and complete medication review. Thank you for your help.  I confirm that guidance regarding antiplatelet and oral anticoagulation therapy has been completed and, if necessary, noted below.  Patient is not on anticoagulation or antiplatelet per review of medical record in Epic.    I also confirmed the patient resides in the state of [place] . As per [facility] Medical Board telemedicine laws, the patient must reside in the state in which the provider is licensed.   Lamarr Satterfield, NP 08/30/2023, 2:43 PM [facility]

## 2023-08-30 NOTE — Telephone Encounter (Signed)
 Patient returning call.

## 2023-08-30 NOTE — Telephone Encounter (Signed)
 Left message to schedule tele pre op appt.

## 2023-08-30 NOTE — Telephone Encounter (Signed)
   Pre-operative Risk Assessment    Patient Name: Tiffany Cunningham  DOB: 10-17-1955 MRN: [id number]   Date of last office visit: 06/25/22 DR. SKAINS Date of next office visit: NONE   Request for Surgical Clearance    Procedure:  RIGHT ENDOSCOPIC GLUTE REPAIR WITH IT BAND RELEASE   Date of Surgery:  Clearance 09/04/23                                Surgeon:  DR. CHARM CHURCH JARVIS Surgeon's Group or Practice Name:  [facility] Phone number:  [phone] Fax number:  [phone]   Type of Clearance Requested:   - Medical ; NONE INDICATED TO BE HELD   Type of Anesthesia:  Not Indicated   Additional requests/questions:    Bonney Niels Jest   08/30/2023, 2:30 PM

## 2023-08-30 NOTE — Telephone Encounter (Signed)
 Pt has been scheduled tele preop appt 09/02/23, ok per Orren Fabry, PAC add on due to procedure date. Med rec and consent are done.

## 2023-09-02 ENCOUNTER — Ambulatory Visit: Attending: Internal Medicine | Admitting: Student

## 2023-09-02 DIAGNOSIS — Z01810 Encounter for preprocedural cardiovascular examination: Secondary | ICD-10-CM

## 2023-09-02 NOTE — Progress Notes (Signed)
 Virtual Visit via Telephone Note   Because of TONIANNE FINE co-morbid illnesses, she is at least at moderate risk for complications without adequate follow up.  This format is felt to be most appropriate for this patient at this time.  The patient did not have access to video technology/had technical difficulties with video requiring transitioning to audio format only (telephone).  All issues noted in this document were discussed and addressed.  No physical exam could be performed with this format.  Please refer to the patient's chart for her consent to telehealth for [facility].  Evaluation Performed:  Preoperative cardiovascular risk assessment _____________   Date:  09/02/2023   Patient ID:  Tiffany Cunningham, DOB 12-12-1955, MRN [id number] Patient Location:  Home Provider location:   Office  Primary Care Provider:  Samie Frederick, PA-C Primary Cardiologist:  Oneil Parchment, MD  Chief Complaint / Patient Profile   68 y.o. y/o female with a h/o mitral valve prolapse, aortic atherosclerosis, hypertension, palpitations, fibromyalgia who is pending right endoscopic glute repair with IT band release by Dr. Larinda on 09/04/2023 and presents today for telephonic preoperative cardiovascular risk assessment.  History of Present Illness    Tiffany Cunningham is a 68 y.o. female who presents via audio/video conferencing for a telehealth visit today.  Pt was last seen in cardiology clinic on 06/25/2022 by Dr. Parchment.  At that time Tiffany Cunningham was stable from a cardiac standpoint.  The patient is now pending procedure as outlined above. Since her last visit, she is doing well. Patient denies shortness of breath, dyspnea on exertion, lower extremity edema, orthopnea or PND. No chest pain, pressure, or tightness. No palpitations.  No lightheadedness or dizziness. She was very active walking 4 miles 5 days a week until the end of November when she injured her hip. Since that time she continues to be  independent with ADLs and perform light to moderate household activities.   Past Medical History    Past Medical History:  Diagnosis Date   Aortic atherosclerosis (HCC) 06/30/2021   Mitral valve prolapse    Question of MVP in 2017 though looks like they didnt see this on repeat echo according to cards 2021 office note.   Urticaria    Past Surgical History:  Procedure Laterality Date   APPENDECTOMY  02/05/1977   CHOLECYSTECTOMY N/A 07/01/2021   Procedure: LAPAROSCOPIC CHOLECYSTECTOMY WITH INTRAOPERATIVE CHOLANGIOGRAM;  Surgeon: Aron Shoulders, MD;  Location: [facility];  Service: General;  Laterality: N/A;   HIP SURGERY Left    OSTEOTOMY  02/06/1991   SHOULDER SURGERY Left     Allergies  Allergies  Allergen Reactions   Ace Inhibitors Cough    Home Medications    Prior to Admission medications   Medication Sig Start Date End Date Taking? Authorizing Provider  amLODipine  (NORVASC ) 5 MG tablet Take 5 mg by mouth daily. 04/30/21   [provider]  Azelastine -Fluticasone  137-50 MCG/ACT SUSP Place 1 spray into the nose 2 (two) times daily as needed. 06/12/21   Luke Orlan CHRISTELLA, DO  Cholecalciferol (VITAMIN D3) 25 MCG (1000 UT) capsule Take 1,000 Units by mouth daily. softgels    [provider]  diclofenac (VOLTAREN) 75 MG EC tablet Take 75 mg by mouth 2 (two) times daily. 06/02/21   [provider]  DULoxetine  (CYMBALTA ) 60 MG capsule Take 60 mg by mouth daily. 10/23/19   [provider]  losartan-hydrochlorothiazide (HYZAAR) 100-25 MG tablet Take 1 tablet by mouth daily.  [provider]  Magnesium  Oxide 250 MG TABS Take 250 mg by mouth at bedtime.    [provider]  Multiple Vitamin (MULTIVITAMIN WITH MINERALS) TABS tablet Take 1 tablet by mouth daily.    [provider]  Omega-3 Fatty Acids (FISH OIL) 1000 MG CAPS Take 3 capsules by mouth daily. Patient taking differently: Take 2 capsules by mouth daily.    [provider]  Polyethyl Glycol-Propyl Glycol (SYSTANE OP) Place 1 drop into both eyes in the morning and at bedtime.    [provider]  Potassium 99 MG TABS daily at 6 (six) AM.    [provider]  Potassium Gluconate 550 (90 K) MG TABS Take 1 tablet by mouth daily. Patient not taking: Reported on 08/30/2023    [provider]  rosuvastatin (CRESTOR) 5 MG tablet Take 5 mg by mouth daily. 08/04/23   [provider]  tiZANidine  (ZANAFLEX ) 4 MG tablet TAKE 1-1.5 TABLETS (4-6 MG TOTAL) BY MOUTH AT BEDTIME. 08/05/23   Babs Arthea DASEN, MD    Physical Exam    Vital Signs:  Tiffany Cunningham does not have vital signs available for review today.  Given telephonic nature of communication, physical exam is limited. AAOx3. NAD. Normal affect.  Speech and respirations are unlabored.   Assessment & Plan    Primary Cardiologist: Oneil Parchment, MD  Preoperative cardiovascular risk assessment.  Right endoscopic gluteal repair with IT band release by Dr. Larinda on 09/04/2023.  Chart reviewed as part of pre-operative protocol coverage. According to the RCRI, patient has a 0.5% risk of MACE. Patient reports activity equivalent to 4.0 METS (performs light to moderate household activities).   Given past medical history and time since last visit, based on ACC/AHA guidelines, Tiffany Cunningham would be at acceptable risk for the planned procedure without further cardiovascular testing.   Patient was advised that if she develops new symptoms prior to surgery to contact our office to arrange a follow-up appointment.  she verbalized understanding.  I will route this recommendation to the requesting party via Epic fax function.  Please call with questions.  Time:   Today, I have spent 5 minutes with the patient with telehealth technology discussing medical history, symptoms, and management plan.     Barnie Hila, NP  09/02/2023, 8:52 AM

## 2023-09-03 NOTE — Telephone Encounter (Signed)
 Requesting office called in for clearance to faxed over. Please advise.

## 2023-09-03 NOTE — Telephone Encounter (Signed)
 I will re-fax the notes from Barnie Hila, NP from tele visit for preop clearance yesterday, see notes providing clearance.

## 2023-09-03 NOTE — H&P (Signed)
 Orthopedic & Sports Medicine Clinic  [facility] [address] [place], [address]  [place], [place] [address] [phone]  Subjective   Tiffany Cunningham is a 68 y.o. female who presents today with:  No chief complaint on file.   History of Present Illness: History of Present Illness The patient is a 67 year old female who presents with right hip pain.   She reports experiencing right hip pain, rated as 5 out of 10, since the fall of 2024. The pain began after she injured her hip while playing soccer and falling on Thanksgiving. It has been progressively worsening, primarily affecting the outside of her hip and radiating down and around it. She describes the pain as a combination of weakness, burning, and loss of motion, which intensifies with sitting, standing, walking, climbing stairs, engaging in athletic activities, and lying in bed at night. She has been taking diclofenac and Cymbalta  for relief. She completed a 6-week course of physical therapy a few weeks ago and received a steroid injection in 05/2023. She enjoys walking, swimming, and playing with her grandchildren. She is a Futures trader.   She underwent arthroscopic repair of her left hip during the COVID-19 pandemic, performed by Dr. Rolinda in [place]. Her recovery was uneventful, and she was able to bear 25 percent weight on the affected leg after a couple of weeks. She transitioned to using one crutch after a week or two and then discontinued its use. She has been performing physical therapy exercises daily since her left hip surgery to maintain her fitness. She first noticed an issue when she was unable to lift her leg while lying on her side.  PAST SURGICAL HISTORY: Arthroscopic repair of the left hip during the COVID-19 pandemic.  SOCIAL HISTORY Occupations: Homemaker Exercise: Walking, swimming, playing with grandchildren  Past Medical History, Past Surgery History, Current Medications, Allergies, Social History, Family  History, and Problem List were reviewed and updated as appropriate.  Past Medical History:  Diagnosis Date  . Arthritis   . Heart murmur   . Hyperlipidemia   . Hypertension    MEDICATION.  UNDER CONTROL PER PT. 06/12/19.  . Jaw protrusion    had surgery   . Migraine   . Mitral valve prolapse    SEES CARDS EVERY TWO YEARS.  . Seasonal allergies    Past Surgical History:  Procedure Laterality Date  . Appendectomy  02/05/1977  . Foot surgery Right    TENDON REPAIR.  SABRA Hip surgery Left 2022   Dr. Rolinda  . Knee arthroscopy Right 06/19/2019   RIGHT knee arthroscopy with partial medial and lateral meniscectomy  . Mandible surgery     MOVED JAW FORWARD.  SABRA Orif clavicle fracture Left 04/18/2023   ORIF LEFT midshaft clavicle fracture nonunion  . Shoulder open rotator cuff repair Left 02/18/2020   LEFT shoulder arthroscopy with limited debridement, subacromial decompression/acromioplasty, distal clavicle resection, and open rotator cuff repair   Current Outpatient Medications  Medication Instructions  . amLODIPine  besylate (NORVASC ) 5 mg, Oral, Daily  . diclofenac sodium (VOLTAREN) 75 mg, Oral, 2 times a day as needed  . DULoxetine  HCl (CYMBALTA ) 60 mg, Oral, Daily  . DYMISTA  137-50 MCG/ACT SUSP 1 spray, As needed  . fish oil (SEA-OMEGA) 1000 mg CAPS 1 capsule, Daily  . losartan-hydrochlorothiazide (HYZAAR) 100-25 MG per tablet 1 tablet, Oral, Daily  . Magnesium  250 MG TABS 1 tablet, Daily  . Multiple Vitamin (MULTIVITAMIN) tablet 1 tablet, Daily  . pantoprazole sodium (PROTONIX) 40 mg, Oral, Daily  .  Potassium Gluconate 550 (90 K) MG TABS 1 tablet, Daily  . rosuvastatin calcium (CRESTOR) 5 mg, Oral, Daily  . tizanidine  (ZANAFLEX ) 4 mg  . VITAMIN D PO 1,000 Units   Objective   There were no vitals filed for this visit.   General Exam Constitutional - alert, oriented, pleasant, normal mood and affect Cardiovascular - Normal perfusion without edema to the lower  extremities Skin - intact with no rashes, blemishes, or lesions Respiratory - normal effort no distress  Neurologic- sensibility grossly intact throughout   PHYSICAL EXAM: Physical Exam Musculoskeletal: Left hip: 5 well-healed incisions, no pain with hip range of motion, negative FABER, negative FADIR Right hip: Hip flexion is 100 degrees, external rotation is 30 degrees, internal rotation is 30 degrees, negative FADIR, positive FABER, positive trochanteric pain sign, sensitivity to touch, positive tenderness over the greater trochanter, abductor strength is 2 out of 5  Imaging: Results for orders placed in visit on 07/08/23  XR Clavicle Left  Narrative IMAGING:  Impression X-RAYS: 2 AP views of the LEFT clavicle.  The images were obtained and reviewed in the office today. INTERPRETATION: Postsurgical changes with midshaft clavicle fracture healing well in good alignment with internal fixation intact.    Results Imaging  - MRI of the right hip: Significant trochanteric bursitis, high grade partial tearing of the distal 4 cm of glute medius tendon, low grade glute minimus fraying.  - MRI of the left hip: Apparent previous repair, small fluid collection, degenerative fraying of the labrum.  - MRI of the lumbar spine: Moderate degenerative changes. I have personally reviewed any relevant imaging with the patient.  Impression   1. Tendinopathy of right gluteal region  TENDON REPAIR/RECONSTRUCTION   TENDON REPAIR/RECONSTRUCTION    2. Trochanteric bursitis, right hip  TENDON REPAIR/RECONSTRUCTION   TENDON REPAIR/RECONSTRUCTION    3. Tear of gluteus medius tendon, subsequent encounter  TENDON REPAIR/RECONSTRUCTION   TENDON REPAIR/RECONSTRUCTION       Plan  Aerial's history and exam today are consistent with:  Assessment & Plan 1. Greater Trochanteric Pain Syndrome: The patient's symptoms and MRI findings are consistent with greater trochanteric pain syndrome, characterized  by high-grade partial tearing of the distal 4 cm of the gluteus medius tendon and low-grade fraying of the gluteus minimus tendon. The potential causes of GTPS including issues with the gluteal tendons, IT band issues, and trochanteric bursitis were discussed. The treatment options including endoscopic surgery were discussed in detail. The risks associated with surgery including bleeding, infection, blood clot, retear of tendon, continued pain were also discussed. A comprehensive discussion was held regarding the potential benefits and risks associated with endoscopic surgery for this condition. The patient was informed that the procedure would involve a bursectomy, glute tendon repair, and IT band lengthening. Postoperative care would include the use of crutches or a walker for approximately 4 weeks, followed by the use of a cane or crutch on the opposite side. Physical therapy would commence within a week post-surgery. The patient was advised to continue her current pain management regimen and to engage in physical therapy exercises to strengthen her gluteal muscles.   We have reviewed the stated surgical procedure, risks, benefits, alternatives, recovery, and postoperative expectations.  The surgical risks include: infection, bleeding, damage to surrounding neurovascular structures, joint stiffness, continued pain, possible need for further surgery, complications related to deep venous thrombosis, and anesthetic risks.    Given the perceived low risk for these complications, as well as lack of improvement or further projected lack  of improvement with conservative management, she expresses understanding, and requests to proceed with surgery.    Surgical Plan: Recommend right endoscopic gluteal tendon repair, iliotibial band lengthening, trochanteric bursectomy and possible regenerative patch.  Appointments which have been scheduled    Aug 19, 2023 8:00 AM Annual Physical with Tylene KATHEE Meres,  PA-C [facility] [place] (--) [address] [place] [address] [phone]   Aug 19, 2023 12:30 PM PHONE CALL ONLY with [facility] SURG NAV - OR CATEGORY 4 / 5 PHONE CALL ROOM 3 [facility] PAT) [address] [place] [address] [phone]   Sep 18, 2023 11:40 AM Postop with Jodi A Arreguin, PA-C [facility] [place] (--) [address] [place] [address] [phone]       No orders of the defined types were placed in this encounter.      Designer, fashion/clothing was used in the creation of this visit note. Consent from the patient/caregiver was obtained prior to its use.  CHARM Vara Fontana, MD Orthopedic Surgery  Sports Medicine/Hip Preservation  CC: Fontana BIRCH. Vara, MD

## 2023-10-14 ENCOUNTER — Ambulatory Visit: Admitting: Cardiology

## 2023-10-23 ENCOUNTER — Encounter: Payer: Self-pay | Admitting: Physical Medicine & Rehabilitation

## 2023-10-23 ENCOUNTER — Encounter: Attending: Physical Medicine & Rehabilitation | Admitting: Physical Medicine & Rehabilitation

## 2023-10-23 VITALS — BP 148/80 | HR 76 | Ht 64.0 in | Wt 170.2 lb

## 2023-10-23 DIAGNOSIS — M7918 Myalgia, other site: Secondary | ICD-10-CM | POA: Insufficient documentation

## 2023-10-23 DIAGNOSIS — G894 Chronic pain syndrome: Secondary | ICD-10-CM | POA: Insufficient documentation

## 2023-10-23 NOTE — Progress Notes (Signed)
 Subjective:    Patient ID: Tiffany Cunningham, female    DOB: 08-20-1955, 68 y.o.   MRN: [id number]  HPI  Mrs. Roesler is here in follow up of her chronic pain. She had surgery on her right hip in [place], tendon reconstruction. She will have right shoulder in the coming months.  She states that she has been doing some reading on fibromyalgia forms and that she does not have symptoms consistent with this.  Additionally she complains of pain in her low back as well as her hands and generalized pain distending down to her feet and ankles.  She does not feel that anything that we have done in this clinic is really helped her pain over the last year plus.    Pain Inventory Average Pain 4 Pain Right Now 2 My pain is constant, sharp, burning, stabbing, and aching  In the last 24 hours, has pain interfered with the following? General activity 4 Relation with others 1 Enjoyment of life 4 What TIME of day is your pain at its worst? morning , daytime, evening, and night Sleep (in general) Poor  Pain is worse with: walking, bending, sitting, inactivity, standing, and some activites Pain improves with: na Relief from Meds: 2  Family History  Problem Relation Age of Onset   Allergic rhinitis Mother    Cancer Father    Clotting disorder Father    Allergic rhinitis Sister    Asthma Sister    Eczema Daughter    Asthma Cousin    Urticaria Neg Hx    Immunodeficiency Neg Hx    Social History   Socioeconomic History   Marital status: Married    Spouse name: Not on file   Number of children: Not on file   Years of education: Not on file   Highest education level: Not on file  Occupational History   Not on file  Tobacco Use   Smoking status: Never   Smokeless tobacco: Never  Vaping Use   Vaping status: Never Used  Substance and Sexual Activity   Alcohol use: No    Alcohol/week: 0.0 standard drinks of alcohol   Drug use: No   Sexual activity: Not on file  Other Topics Concern   Not  on file  Social History Narrative   Not on file   Social Drivers of Health   Financial Resource Strain: Low Risk  (08/16/2023)   Received [facility]   Overall Financial Resource Strain (CARDIA)    Difficulty of Paying Living Expenses: Not hard at all  Food Insecurity: No Food Insecurity (08/16/2023)   Received from [facility]   Hunger Vital Sign    Within the past 12 months, you worried that your food would run out before you got the money to buy more.: Never true    Within the past 12 months, the food you bought just didn't last and you didn't have money to get more.: Never true  Transportation Needs: No Transportation Needs (08/16/2023)   Received from [facility] - Transportation    Lack of Transportation (Medical): No    Lack of Transportation (Non-Medical): No  Physical Activity: Insufficiently Active (08/16/2023)   Received from [facility]   Exercise Vital Sign    On average, how many days per week do you engage in moderate to strenuous exercise (like a brisk walk)?: 1 day    On average, how many minutes do you engage in exercise at this level?: 120 min  Stress: No Stress Concern Present (08/16/2023)   Received from [facility] - Occupational Stress Questionnaire    Feeling of Stress : Only a little  Social Connections: Moderately Integrated (08/16/2023)   Received from [facility]   Social Network    How would you rate your social network (family, work, friends)?: Adequate participation with social networks   Past Surgical History:  Procedure Laterality Date   APPENDECTOMY  02/05/1977   CHOLECYSTECTOMY N/A 07/01/2021   Procedure: LAPAROSCOPIC CHOLECYSTECTOMY WITH INTRAOPERATIVE CHOLANGIOGRAM;  Surgeon: Aron Shoulders, MD;  Location: [facility];  Service: General;  Laterality: N/A;   HIP SURGERY Left    OSTEOTOMY  02/06/1991   SHOULDER SURGERY Left    Past Surgical History:  Procedure Laterality Date    APPENDECTOMY  02/05/1977   CHOLECYSTECTOMY N/A 07/01/2021   Procedure: LAPAROSCOPIC CHOLECYSTECTOMY WITH INTRAOPERATIVE CHOLANGIOGRAM;  Surgeon: Aron Shoulders, MD;  Location: [facility];  Service: General;  Laterality: N/A;   HIP SURGERY Left    OSTEOTOMY  02/06/1991   SHOULDER SURGERY Left    Past Medical History:  Diagnosis Date   Aortic atherosclerosis (HCC) 06/30/2021   Mitral valve prolapse    Question of MVP in 2017 though looks like they didnt see this on repeat echo according to cards 2021 office note.   Urticaria    BP (!) 149/80   Pulse 76   Ht 5' 4 (1.626 m)   Wt 170 lb 3.2 oz (77.2 kg)   SpO2 94%   BMI 29.21 kg/m   Opioid Risk Score:   Fall Risk Score:  `1  Depression screen [facility] 2/9     10/23/2023   11:38 AM 04/24/2023    1:13 PM 10/24/2022    1:00 PM 03/28/2022    1:40 PM  Depression screen PHQ 2/9  Decreased Interest 0 0 0 0  Down, Depressed, Hopeless 0 0 0 0  PHQ - 2 Score 0 0 0 0  Altered sleeping    3  Tired, decreased energy    2  Feeling bad or failure about yourself     0  Trouble concentrating    0  Moving slowly or fidgety/restless    0  Suicidal thoughts    0  PHQ-9 Score    5  Difficult doing work/chores    Somewhat difficult     Review of Systems  Musculoskeletal:  Positive for back pain and neck pain.  All other systems reviewed and are negative.      Objective:   Physical Exam General: No acute distress HEENT: NCAT, EOMI, oral membranes moist Cards: reg rate  Chest: normal effort Abdomen: Soft, NT, ND Skin: dry, intact Extremities: no edema Psych: pleasant and appropriate  Skin: intact Neuro: Sensory exam normal for light touch and pain in all 4 limbs. No limb ataxia or cerebellar signs. No abnormal tone appreciated.     Musculoskeletal: left arm in sling. Did not examine low back or hands.                    Assessment & Plan:  Chronic pain syndrome.  We have discussed some of generalized pain which could be consistent  with fibromyalgia syndrome but that a lot of her pain has to do with her history of multiple arthritides involving her left shoulder, left hip and knees.  She has had numerous surgeries involving the left shoulder hip and knee over the last few years.  Recent  right hip surgery       Plan: Cymbalta  60 daily should continue.  Encouraged physical activity to tolerance. Outpt PT Continue  diclofenac 75 mg p.o. twice daily scheduled with food.     Supplements : she is welcome to continue these but she doesn't feel that any have helped.   Pt doesn't feel that we have accomplished anything that has helped her pain. She is concerned that she has Sojren's syndrome. I encouraged her to seek a second rheum opinion and to follow up with ortho about her back and hands.  She will seek some other opinions regarding her pain.  I will not schedule her for follow-up.   About 11 minutes of time was spent today with the patient in discussion and assessment of her pain.  I will see her back as needed

## 2023-10-23 NOTE — Patient Instructions (Addendum)
 ALWAYS FEEL FREE TO CALL OUR OFFICE WITH ANY PROBLEMS OR QUESTIONS [phone])  **PLEASE NOTE** ALL MEDICATION REFILL REQUESTS (INCLUDING CONTROLLED SUBSTANCES) NEED TO BE MADE AT LEAST 7 DAYS PRIOR TO REFILL BEING DUE. ANY REFILL REQUESTS INSIDE THAT TIME FRAME MAY RESULT IN DELAYS IN RECEIVING YOUR PRESCRIPTION.    RECOMMEND ORTHO FOLLOW UP FOR HAND AND BACK PAIN.   CONSIDER SECOND RHEUMATOLOGY OPINION
